# Patient Record
Sex: Female | Born: 1965 | Hispanic: Yes | Marital: Married | Smoking: Never smoker
Health system: Southern US, Community
[De-identification: ages and names within clinical notes are randomized; demographics above are authoritative.]

## PROBLEM LIST (undated history)

## (undated) DIAGNOSIS — Z86018 Personal history of other benign neoplasm: Secondary | ICD-10-CM

## (undated) DIAGNOSIS — D259 Leiomyoma of uterus, unspecified: Secondary | ICD-10-CM

## (undated) DIAGNOSIS — D649 Anemia, unspecified: Secondary | ICD-10-CM

## (undated) DIAGNOSIS — K219 Gastro-esophageal reflux disease without esophagitis: Secondary | ICD-10-CM

## (undated) HISTORY — DX: Leiomyoma of uterus, unspecified: D25.9

## (undated) HISTORY — DX: Personal history of other benign neoplasm: Z86.018

## (undated) HISTORY — DX: Anemia, unspecified: D64.9

---

## 2012-07-15 ENCOUNTER — Observation Stay: Payer: Self-pay | Admitting: Obstetrics and Gynecology

## 2012-07-15 LAB — COMPREHENSIVE METABOLIC PANEL
Albumin: 3.8 g/dL (ref 3.4–5.0)
Alkaline Phosphatase: 113 U/L (ref 50–136)
BUN: 13 mg/dL (ref 7–18)
Bilirubin,Total: 0.5 mg/dL (ref 0.2–1.0)
Calcium, Total: 8.4 mg/dL — ABNORMAL LOW (ref 8.5–10.1)
Chloride: 106 mmol/L (ref 98–107)
EGFR (African American): >60
Potassium: 3.5 mmol/L (ref 3.5–5.1)
SGOT(AST): 54 U/L — ABNORMAL HIGH (ref 15–37)
SGPT (ALT): 28 U/L (ref 12–78)
Sodium: 137 mmol/L (ref 136–145)
Total Protein: 7.6 g/dL (ref 6.4–8.2)

## 2012-07-15 LAB — URINALYSIS, COMPLETE
Glucose,UR: NEGATIVE mg/dL (ref 0–75)
Ketone: NEGATIVE
RBC,UR: 1 /HPF (ref 0–5)
Squamous Epithelial: 1

## 2012-07-15 LAB — CBC
HCT: 22.6 % — ABNORMAL LOW (ref 35.0–47.0)
HGB: 6.4 g/dL — ABNORMAL LOW (ref 12.0–16.0)
MCHC: 28.5 g/dL — ABNORMAL LOW (ref 32.0–36.0)
RBC: 3.98 10*6/uL (ref 3.80–5.20)
RDW: 19.6 % — ABNORMAL HIGH (ref 11.5–14.5)
WBC: 6.1 10*3/uL (ref 3.6–11.0)

## 2012-07-15 LAB — HCG, QUANTITATIVE, PREGNANCY: Beta Hcg, Quant.: <1 m[IU]/mL — ABNORMAL LOW

## 2012-07-16 LAB — CBC WITH DIFFERENTIAL/PLATELET
Basophil #: 0.1 10*3/uL (ref 0.0–0.1)
Basophil %: 0.9 %
Eosinophil #: 0.1 10*3/uL (ref 0.0–0.7)
HCT: 29.1 % — ABNORMAL LOW (ref 35.0–47.0)
HGB: 8.9 g/dL — ABNORMAL LOW (ref 12.0–16.0)
Lymphocyte #: 1.4 10*3/uL (ref 1.0–3.6)
Lymphocyte %: 25 %
MCH: 20 pg — ABNORMAL LOW (ref 26.0–34.0)
MCV: 65 fL — ABNORMAL LOW (ref 80–100)
Monocyte #: 0.4 x10 3/mm (ref 0.2–0.9)
Monocyte %: 6.7 %
Neutrophil %: 66 %
Platelet: 179 10*3/uL (ref 150–440)
RBC: 4.47 10*6/uL (ref 3.80–5.20)
RDW: 29.9 % — ABNORMAL HIGH (ref 11.5–14.5)

## 2012-09-14 ENCOUNTER — Emergency Department: Payer: Self-pay | Admitting: Emergency Medicine

## 2012-09-14 LAB — CBC
HCT: 37.4 % (ref 35.0–47.0)
HGB: 12.7 g/dL (ref 12.0–16.0)
MCV: 78 fL — ABNORMAL LOW (ref 80–100)
Platelet: 124 10*3/uL — ABNORMAL LOW (ref 150–440)
RBC: 4.77 10*6/uL (ref 3.80–5.20)
RDW: 30.6 % — ABNORMAL HIGH (ref 11.5–14.5)

## 2012-09-14 LAB — COMPREHENSIVE METABOLIC PANEL
Albumin: 3.9 g/dL (ref 3.4–5.0)
Anion Gap: 8 (ref 7–16)
BUN: 10 mg/dL (ref 7–18)
Calcium, Total: 8.6 mg/dL (ref 8.5–10.1)
Chloride: 109 mmol/L — ABNORMAL HIGH (ref 98–107)
Co2: 23 mmol/L (ref 21–32)
Creatinine: 0.99 mg/dL (ref 0.60–1.30)
Glucose: 105 mg/dL — ABNORMAL HIGH (ref 65–99)
Potassium: 3.4 mmol/L — ABNORMAL LOW (ref 3.5–5.1)
SGOT(AST): 24 U/L (ref 15–37)
SGPT (ALT): 23 U/L (ref 12–78)
Total Protein: 7.9 g/dL (ref 6.4–8.2)

## 2012-09-14 LAB — URINALYSIS, COMPLETE
Bilirubin,UR: NEGATIVE
Glucose,UR: NEGATIVE mg/dL (ref 0–75)
Ketone: NEGATIVE
Leukocyte Esterase: NEGATIVE
Ph: 7 (ref 4.5–8.0)
Protein: NEGATIVE
Specific Gravity: 1.019 (ref 1.003–1.030)
WBC UR: 1 /HPF (ref 0–5)

## 2014-07-22 NOTE — Consult Note (Signed)
Brief Consult Note: Patient was seen by consultant.   Recommend further assessment or treatment.   Orders entered.   Discussed with Attending MD.   Comments: Menorrhagia to anemia, currently no active bleeding.  Transfuse 2 units.  Patient with non-cardiac chest pain PPI, medicine following.  Normal EKG and negative cardiac enzymes, pain reproducible.  Electronic Signatures: Dorthula Nettles (MD)  (Signed 16-Apr-14 22:17)  Authored: Brief Consult Note   Last Updated: 16-Apr-14 22:17 by Dorthula Nettles (MD)

## 2015-06-23 ENCOUNTER — Emergency Department: Payer: Self-pay

## 2015-06-23 ENCOUNTER — Encounter: Payer: Self-pay | Admitting: Emergency Medicine

## 2015-06-23 ENCOUNTER — Inpatient Hospital Stay
Admission: EM | Admit: 2015-06-23 | Discharge: 2015-06-27 | DRG: 759 | Disposition: A | Discharge status: Home / Self Care | Payer: Self-pay | Attending: Obstetrics and Gynecology | Admitting: Obstetrics and Gynecology

## 2015-06-23 DIAGNOSIS — D509 Iron deficiency anemia, unspecified: Secondary | ICD-10-CM | POA: Diagnosis present

## 2015-06-23 DIAGNOSIS — Z86018 Personal history of other benign neoplasm: Secondary | ICD-10-CM | POA: Diagnosis present

## 2015-06-23 DIAGNOSIS — D259 Leiomyoma of uterus, unspecified: Secondary | ICD-10-CM | POA: Diagnosis present

## 2015-06-23 DIAGNOSIS — R1030 Lower abdominal pain, unspecified: Secondary | ICD-10-CM

## 2015-06-23 DIAGNOSIS — N7093 Salpingitis and oophoritis, unspecified: Principal | ICD-10-CM | POA: Diagnosis present

## 2015-06-23 LAB — URINALYSIS COMPLETE WITH MICROSCOPIC (ARMC ONLY)
BILIRUBIN URINE: NEGATIVE
Bacteria, UA: NONE SEEN
GLUCOSE, UA: NEGATIVE mg/dL
HGB URINE DIPSTICK: NEGATIVE
LEUKOCYTES UA: NEGATIVE
NITRITE: NEGATIVE
Protein, ur: NEGATIVE mg/dL
pH: 5 (ref 5.0–8.0)

## 2015-06-23 LAB — HCG, QUANTITATIVE, PREGNANCY: HCG, BETA CHAIN, QUANT, S: 1 m[IU]/mL (ref <5)

## 2015-06-23 LAB — CBC WITH DIFFERENTIAL/PLATELET
BASOS ABS: 0 10*3/uL (ref 0–0.1)
BASOS PCT: 0 %
EOS ABS: 0 10*3/uL (ref 0–0.7)
EOS PCT: 0 %
HCT: 29.1 % — ABNORMAL LOW (ref 35.0–47.0)
Hemoglobin: 8.9 g/dL — ABNORMAL LOW (ref 12.0–16.0)
Lymphocytes Relative: 5 %
Lymphs Abs: 0.7 10*3/uL — ABNORMAL LOW (ref 1.0–3.6)
MCH: 18.6 pg — ABNORMAL LOW (ref 26.0–34.0)
MCHC: 30.5 g/dL — ABNORMAL LOW (ref 32.0–36.0)
MCV: 61 fL — ABNORMAL LOW (ref 80.0–100.0)
MONO ABS: 0.5 10*3/uL (ref 0.2–0.9)
Monocytes Relative: 3 %
Neutro Abs: 13.7 10*3/uL — ABNORMAL HIGH (ref 1.4–6.5)
Neutrophils Relative %: 92 %
PLATELETS: 282 10*3/uL (ref 150–440)
RBC: 4.77 MIL/uL (ref 3.80–5.20)
RDW: 19.6 % — AB (ref 11.5–14.5)
WBC: 15 10*3/uL — AB (ref 3.6–11.0)

## 2015-06-23 LAB — COMPREHENSIVE METABOLIC PANEL
ALT: 40 U/L (ref 14–54)
AST: 43 U/L — AB (ref 15–41)
Albumin: 3.4 g/dL — ABNORMAL LOW (ref 3.5–5.0)
Alkaline Phosphatase: 223 U/L — ABNORMAL HIGH (ref 38–126)
Anion gap: 10 (ref 5–15)
BILIRUBIN TOTAL: 0.8 mg/dL (ref 0.3–1.2)
BUN: 10 mg/dL (ref 6–20)
CO2: 22 mmol/L (ref 22–32)
CREATININE: 0.77 mg/dL (ref 0.44–1.00)
Calcium: 8.7 mg/dL — ABNORMAL LOW (ref 8.9–10.3)
Chloride: 102 mmol/L (ref 101–111)
Glucose, Bld: 124 mg/dL — ABNORMAL HIGH (ref 65–99)
Potassium: 3.6 mmol/L (ref 3.5–5.1)
Sodium: 134 mmol/L — ABNORMAL LOW (ref 135–145)
TOTAL PROTEIN: 7.7 g/dL (ref 6.5–8.1)

## 2015-06-23 LAB — POCT PREGNANCY, URINE: PREG TEST UR: NEGATIVE

## 2015-06-23 LAB — LACTIC ACID, PLASMA: Lactic Acid, Venous: 0.9 mmol/L (ref 0.5–2.0)

## 2015-06-23 LAB — LIPASE, BLOOD: LIPASE: 16 U/L (ref 11–51)

## 2015-06-23 LAB — RAPID HIV SCREEN (HIV 1/2 AB+AG)
HIV 1/2 Antibodies: NONREACTIVE
HIV-1 P24 Antigen - HIV24: NONREACTIVE

## 2015-06-23 LAB — CHLAMYDIA/NGC RT PCR (ARMC ONLY)
CHLAMYDIA TR: NOT DETECTED
N gonorrhoeae: NOT DETECTED

## 2015-06-23 MED ORDER — DOXYCYCLINE HYCLATE 100 MG PO TABS
100.0000 mg | ORAL_TABLET | Freq: Two times a day (BID) | ORAL | Status: DC
  Administered 2015-06-24: 100 mg via ORAL
  Filled 2015-06-23: qty 1

## 2015-06-23 MED ORDER — MORPHINE SULFATE (PF) 4 MG/ML IV SOLN: 4.0000 mg | Freq: Once | INTRAVENOUS | Status: DC

## 2015-06-23 MED ORDER — ALUM & MAG HYDROXIDE-SIMETH 200-200-20 MG/5ML PO SUSP
30.0000 mL | ORAL | Status: DC | PRN
  Filled 2015-06-23: qty 30

## 2015-06-23 MED ORDER — IBUPROFEN 600 MG PO TABS
600.0000 mg | ORAL_TABLET | Freq: Four times a day (QID) | ORAL | Status: DC | PRN
  Administered 2015-06-24 – 2015-06-25 (×2): 600 mg via ORAL
  Filled 2015-06-23 (×3): qty 1

## 2015-06-23 MED ORDER — DIATRIZOATE MEGLUMINE & SODIUM 66-10 % PO SOLN: 15.0000 mL | Freq: Once | ORAL | Status: DC

## 2015-06-23 MED ORDER — DEXTROSE 5 % IV SOLN
2.0000 g | Freq: Once | INTRAVENOUS | Status: AC
  Administered 2015-06-23: 2 g via INTRAVENOUS
  Filled 2015-06-23: qty 2

## 2015-06-23 MED ORDER — ACETAMINOPHEN 325 MG PO TABS
650.0000 mg | ORAL_TABLET | Freq: Once | ORAL | Status: AC
  Administered 2015-06-23: 650 mg via ORAL
  Filled 2015-06-23: qty 2

## 2015-06-23 MED ORDER — OXYCODONE-ACETAMINOPHEN 5-325 MG PO TABS
1.0000 | ORAL_TABLET | ORAL | Status: DC | PRN
  Administered 2015-06-23: 2 via ORAL
  Administered 2015-06-25 – 2015-06-26 (×4): 1 via ORAL
  Administered 2015-06-27: 2 via ORAL
  Filled 2015-06-23 (×3): qty 1
  Filled 2015-06-23 (×3): qty 2
  Filled 2015-06-23 (×2): qty 1

## 2015-06-23 MED ORDER — BISACODYL 5 MG PO TBEC
5.0000 mg | DELAYED_RELEASE_TABLET | Freq: Every day | ORAL | Status: DC | PRN
Start: 2015-06-23 — End: 2015-06-27

## 2015-06-23 MED ORDER — ONDANSETRON HCL 4 MG/2ML IJ SOLN
4.0000 mg | Freq: Once | INTRAMUSCULAR | Status: AC
  Administered 2015-06-23: 4 mg via INTRAVENOUS
  Filled 2015-06-23: qty 2

## 2015-06-23 MED ORDER — DOCUSATE SODIUM 100 MG PO CAPS
100.0000 mg | ORAL_CAPSULE | Freq: Two times a day (BID) | ORAL | Status: DC
  Administered 2015-06-23 – 2015-06-27 (×6): 100 mg via ORAL
  Filled 2015-06-23 (×6): qty 1

## 2015-06-23 MED ORDER — MAGNESIUM CITRATE PO SOLN: 1.0000 | Freq: Once | ORAL | Status: DC | PRN

## 2015-06-23 MED ORDER — MORPHINE SULFATE (PF) 4 MG/ML IV SOLN
4.0000 mg | Freq: Once | INTRAVENOUS | Status: AC
  Administered 2015-06-23: 4 mg via INTRAVENOUS
  Filled 2015-06-23: qty 1

## 2015-06-23 MED ORDER — ONDANSETRON HCL 4 MG PO TABS: 4.0000 mg | ORAL_TABLET | Freq: Four times a day (QID) | ORAL | Status: DC | PRN

## 2015-06-23 MED ORDER — IOPAMIDOL (ISOVUE-300) INJECTION 61%
100.0000 mL | Freq: Once | INTRAVENOUS | Status: AC | PRN
  Administered 2015-06-23: 100 mL via INTRAVENOUS

## 2015-06-23 MED ORDER — METRONIDAZOLE 500 MG PO TABS
2000.0000 mg | ORAL_TABLET | Freq: Once | ORAL | Status: AC
  Administered 2015-06-23: 2000 mg via ORAL
  Filled 2015-06-23: qty 4

## 2015-06-23 MED ORDER — TEMAZEPAM 15 MG PO CAPS
15.0000 mg | ORAL_CAPSULE | Freq: Every evening | ORAL | Status: DC | PRN
  Filled 2015-06-23: qty 1

## 2015-06-23 MED ORDER — ONDANSETRON HCL 4 MG/2ML IJ SOLN
4.0000 mg | Freq: Four times a day (QID) | INTRAMUSCULAR | Status: DC | PRN
  Administered 2015-06-23 – 2015-06-25 (×4): 4 mg via INTRAVENOUS
  Filled 2015-06-23 (×4): qty 2

## 2015-06-23 MED ORDER — LACTATED RINGERS IV SOLN
INTRAVENOUS | Status: DC
Start: 2015-06-23 — End: 2015-06-27
  Administered 2015-06-23 – 2015-06-27 (×10): via INTRAVENOUS

## 2015-06-23 MED ORDER — MAGNESIUM HYDROXIDE 400 MG/5ML PO SUSP: 30.0000 mL | Freq: Every day | ORAL | Status: DC | PRN

## 2015-06-23 MED ORDER — SODIUM CHLORIDE 0.9 % IV BOLUS (SEPSIS)
1000.0000 mL | Freq: Once | INTRAVENOUS | Status: AC
  Administered 2015-06-23: 1000 mL via INTRAVENOUS

## 2015-06-23 MED ORDER — HYDROMORPHONE HCL 1 MG/ML IJ SOLN
0.2000 mg | INTRAMUSCULAR | Status: DC | PRN
  Administered 2015-06-25: 0.6 mg via INTRAVENOUS
  Filled 2015-06-23: qty 1

## 2015-06-23 MED ORDER — CEFOXITIN SODIUM 2 G IV SOLR
2.0000 g | Freq: Four times a day (QID) | INTRAVENOUS | Status: DC
  Administered 2015-06-23 – 2015-06-25 (×7): 2 g via INTRAVENOUS
  Filled 2015-06-23 (×12): qty 2

## 2015-06-23 MED ORDER — DOXYCYCLINE HYCLATE 100 MG PO TABS
100.0000 mg | ORAL_TABLET | Freq: Once | ORAL | Status: AC
  Administered 2015-06-23: 100 mg via ORAL
  Filled 2015-06-23: qty 1

## 2015-06-23 NOTE — ED Provider Notes (Addendum)
[facility] Emergency Department Provider Note  ____________________________________________   I have reviewed the triage vital signs and the nursing notes.   HISTORY  Chief Complaint Abdominal Pain    HPI Sherry Ward is a 50 y.o. female presents today with diffuse abdominal pain, mostly in the lower pelvic associate with occasional fever last seminary days. She states she has a little bit of dysuria. She has not vomited. She also states that this morning she had to absence of diarrhea. She denies any melena or bright red blood per rectum but she's been having watery diarrhea she states. She statesshe has not had any other surgeries except for gynecologic. She states that she has no history of diverticulitis. Most of the pain is on the left side although it radiates all around. She denies flank pain. Nothing makes the pain better nothing makes the pain worse. She denies any significant sick contacts.  History reviewed. No pertinent past medical history.  There are no active problems to display for this patient.   Past Surgical History  Procedure Laterality Date  . Cesarean section      x2    No current outpatient prescriptions on file.  Allergies Review of patient's allergies indicates no known allergies.  History reviewed. No pertinent family history.  Social History Social History  Substance Use Topics  . Smoking status: Never Smoker   . Smokeless tobacco: None  . Alcohol Use: None    Review of Systems Constitutional: Positive fever/chills Eyes: No visual changes. ENT: No sore throat. No stiff neck no neck pain Cardiovascular: Denies chest pain. Respiratory: Denies shortness of breath. Gastrointestinal:   no vomiting.  Positive diarrhea.  No constipation. Genitourinary: See history of present illness Musculoskeletal: Negative lower extremity swelling Skin: Negative for rash. Neurological: Negative for headaches, focal  weakness or numbness. 10-point ROS otherwise negative.  ____________________________________________   PHYSICAL EXAM:  VITAL SIGNS: ED Triage Vitals  Enc Vitals Group     BP 06/23/15 0958 99/51 mmHg     Pulse Rate 06/23/15 0958 117     Resp 06/23/15 0958 20     Temp 06/23/15 0958 100.6 F (38.1 C)     Temp Source 06/23/15 0958 Oral     SpO2 06/23/15 0958 98 %     Weight 06/23/15 0958 140 lb (63.504 kg)     Height 06/23/15 0958 5\' 5"  (1.651 m)     Head Cir --      Peak Flow --      Pain Score 06/23/15 0958 10     Pain Loc --      Pain Edu? --      Excl. in [place]? --     Constitutional: Alert and oriented. She is a nontoxic, but anxious Eyes: Conjunctivae are normal. PERRL. EOMI. Head: Atraumatic. Nose: No congestion/rhinnorhea. Mouth/Throat: Mucous membranes are moist.  Oropharynx non-erythematous. Neck: No stridor.   Nontender with no meningismus Cardiovascular: Normal rate, regular rhythm. Grossly normal heart sounds.  Good peripheral circulation. Respiratory: Normal respiratory effort.  No retractions. Lungs CTAB. Abdominal: Diffusely tender but presently tender in the left lower quadrant with voluntary guarding. No distention. No rebound Back:  There is no focal tenderness or step off there is no midline tenderness there are no lesions noted. there is no CVA tenderness Musculoskeletal: No lower extremity tenderness. No joint effusions, no DVT signs strong distal pulses no edema Neurologic:  Normal speech and language. No gross focal neurologic deficits are appreciated.  Skin:  Skin is warm, dry and intact. No rash noted. Psychiatric: Mood and affect are normal. Speech and behavior are normal.  ____________________________________________   LABS (all labs ordered are listed, but only abnormal results are displayed)  Labs Reviewed  COMPREHENSIVE METABOLIC PANEL  LIPASE, BLOOD  CBC WITH DIFFERENTIAL/PLATELET  URINALYSIS COMPLETEWITH MICROSCOPIC ([place])    ____________________________________________  EKG  I personally interpreted any EKGs ordered by me or triage  ____________________________________________  RADIOLOGY  I reviewed any imaging ordered by me or triage that were performed during my shift and, if possible, patient and/or family made aware of any abnormal findings. ____________________________________________   PROCEDURES  Procedure(s) performed: None  Critical Care performed: None  ____________________________________________   INITIAL IMPRESSION / ASSESSMENT AND PLAN / ED COURSE  Pertinent labs & imaging results that were available during my care of the patient were reviewed by me and considered in my medical decision making (see chart for details). A sheet with 1 week of abdominal pain and vomiting with some mild dysuria also will check urinalysis, given her not in significant abdominal tenderness, we'll obtain a CT scan of the abdomen for concern of possible possible diverticular disease. We'll give her pain medication, nausea medication, IV fluids and reassess  ----------------------------------------- 1:24 PM on 06/23/2015 -----------------------------------------  Patient does not have diverticulitis however she has a tubular infected structure next to the ovaries. She is not pregnant.Pelvic exam: Female nurse chaperone present, no external lesions noted, physiologic vaginal discharge noted possibly purulent dark discharge, minimal cervical motion tenderness, or significant bilateral adnexal tenderness, no mass palpated, there is no significant uterine tenderness or mass. No vaginal bleeding  Ct suggests likely  TOA, we are giving her doxycycline and cefoxitin, we will discuss with OB/GYN and reassess  ----------------------------------------- 1:29 PM on 06/23/2015 -----------------------------------------  Discussed with OB/GYN, Dr. Marcelline Mates, very much appreciate the consult. She agrees with management.  She will come see the patient she did ask me to get an ultrasound to verify likely TOA. She agrees with antibiotic choice and continued therapy as we have already initiated. Patient nontoxic in appearance. ____________________________________________   FINAL CLINICAL IMPRESSION(S) / ED DIAGNOSES  Final diagnoses:  None      This chart was dictated using voice recognition software.  Despite best efforts to proofread,  errors can occur which can change meaning.     Schuyler Amor, MD 06/23/15 1025  Schuyler Amor, MD 06/23/15 [place], MD 06/23/15 [place], MD 06/23/15 1330

## 2015-06-23 NOTE — Progress Notes (Signed)
Rosemarie Ax here to interpret meds and plan of care to patient and orient to room

## 2015-06-23 NOTE — ED Notes (Signed)
abd pain, burning with urination x 8 days

## 2015-06-23 NOTE — H&P (Addendum)
GYNECOLOGY HISTORY AND PHYSICAL  Reason for Consult: pelvic pain with pelvic mass Referring Physician: Charlotte Crumb, MD (ER Physician)  HPI:  Sherry Ward is an 50 y.o. [phone] female who presented to the Emergency room secondary to lower abdominal pain and occasional fever over the past week.  Patient notes that the pain has progressively worsened. Pain is intermittent, and alternates sides, but is mostly on the left.  Pain radiates throughout lower pelvis. No alleviating factors. Denies vomiting, but has had some occasional nausea and diarrhea.  Tmax noted in ER was 100.6.   Pertinent Gynecological History: Menses: flow is regular, however cycles are beginning to become irregular, occasionally skipping months.   Contraception: none Blood transfusions: none Sexually transmitted diseases: no past history Last pap: normal Date: ~ 2 years ago (performed in [place])    Menstrual History: Menarche age: 46  No LMP recorded (lmp unknown).Was approximately 3 months ago.    History reviewed. No pertinent past medical history.  Past Surgical History  Procedure Laterality Date  . Cesarean section      x2    History reviewed. No pertinent family history.  Social History   Social History  . Marital Status: Married    Spouse Name: N/A  . Number of Children: N/A  . Years of Education: N/A   Occupational History  . Not on file.   Social History Main Topics  . Smoking status: Never Smoker   . Smokeless tobacco: Not on file  . Alcohol Use: Not on file  . Drug Use: Not on file  . Sexual Activity: Not on file   Other Topics Concern  . Not on file   Social History Narrative  . No narrative on file    Allergies: No Known Allergies  Medications:  I have reviewed the patient's current medications. No prescriptions prior to admission    Review of Systems  Constitutional: Positive for fever. Negative for chills, weight loss, malaise/fatigue and diaphoresis.  HENT:  Negative for sore throat.   Eyes: Negative for blurred vision and discharge.  Respiratory: Negative for cough and shortness of breath.   Cardiovascular: Negative for chest pain and palpitations.  Gastrointestinal: Positive for nausea, abdominal pain and diarrhea. Negative for heartburn, vomiting, constipation and blood in stool.  Genitourinary: Negative for dysuria, urgency, frequency, hematuria and flank pain.  Musculoskeletal: Negative for myalgias, joint pain and neck pain.  Skin: Negative for itching and rash.  Neurological: Negative for dizziness, seizures and headaches.  Endo/Heme/Allergies: Does not bruise/bleed easily.  Psychiatric/Behavioral: Negative for depression and substance abuse. The patient is not nervous/anxious.      BP 91/47 mmHg  Pulse 90  Temp(Src) 98.6 F (37 C) (Oral)  Resp 20  Ht _0  (1.651 m)  Wt 140 lb (63.504 kg)  BMI 23.30 kg/m2  SpO2 99%  LMP (LMP Unknown)  Physical Exam  Constitutional: She is oriented to person, place, and time. She appears well-developed and well-nourished. She appears distressed.  HENT:  Head: Normocephalic and atraumatic.  Mouth/Throat: Oropharynx is clear and moist.  Eyes: Conjunctivae and EOM are normal. Pupils are equal, round, and reactive to light.  Neck: Normal range of motion. Neck supple. No tracheal deviation present. No thyromegaly present.  GI: Soft. Bowel sounds are normal. She exhibits no distension and no mass. There is tenderness. There is no rebound and no guarding.  Tenderness in lower quadrants bilaterally, right>left  Genitourinary: Uterus normal. No vaginal discharge found.  No cervical motion tenderness. Uterus enlarged, ~  14 week size. Tenderness noted in bilateral adnexae.    Musculoskeletal: Normal range of motion. She exhibits no edema or tenderness.  Neurological: She is alert and oriented to person, place, and time.  Skin: Skin is warm and dry. No rash noted. She is not diaphoretic. No erythema.   Psychiatric: She has a normal mood and affect. Her behavior is normal.    Labs:  Results for orders placed or performed during the hospital encounter of 06/23/15 (from the past 48 hour(s))  Comprehensive metabolic panel     Status: Abnormal   Collection Time: 06/23/15 10:13 AM  Result Value Ref Range   Sodium 134 (L) 135 - 145 mmol/L   Potassium 3.6 3.5 - 5.1 mmol/L   Chloride 102 101 - 111 mmol/L   CO2 22 22 - 32 mmol/L   Glucose, Bld 124 (H) 65 - 99 mg/dL   BUN 10 6 - 20 mg/dL   Creatinine, Ser 0.77 0.44 - 1.00 mg/dL   Calcium 8.7 (L) 8.9 - 10.3 mg/dL   Total Protein 7.7 6.5 - 8.1 g/dL   Albumin 3.4 (L) 3.5 - 5.0 g/dL   AST 43 (H) 15 - 41 U/L   ALT 40 14 - 54 U/L   Alkaline Phosphatase 223 (H) 38 - 126 U/L   Total Bilirubin 0.8 0.3 - 1.2 mg/dL   GFR calc non Af Amer >60 >60 mL/min   GFR calc Af Amer >60 >60 mL/min    Comment: (NOTE) The eGFR has been calculated using the CKD EPI equation. This calculation has not been validated in all clinical situations. eGFR's persistently <60 mL/min signify possible Chronic Kidney Disease.    Anion gap 10 5 - 15  Lipase, blood     Status: None   Collection Time: 06/23/15 10:13 AM  Result Value Ref Range   Lipase 16 11 - 51 U/L  CBC with Differential     Status: Abnormal   Collection Time: 06/23/15 10:13 AM  Result Value Ref Range   WBC 15.0 (H) 3.6 - 11.0 K/uL   RBC 4.77 3.80 - 5.20 MIL/uL   Hemoglobin 8.9 (L) 12.0 - 16.0 g/dL   HCT 29.1 (L) 35.0 - 47.0 %   MCV 61.0 (L) 80.0 - 100.0 fL   MCH 18.6 (L) 26.0 - 34.0 pg   MCHC 30.5 (L) 32.0 - 36.0 g/dL   RDW 19.6 (H) 11.5 - 14.5 %   Platelets 282 150 - 440 K/uL   Neutrophils Relative % 92 %   Neutro Abs 13.7 (H) 1.4 - 6.5 K/uL   Lymphocytes Relative 5 %   Lymphs Abs 0.7 (L) 1.0 - 3.6 K/uL   Monocytes Relative 3 %   Monocytes Absolute 0.5 0.2 - 0.9 K/uL   Eosinophils Relative 0 %   Eosinophils Absolute 0.0 0 - 0.7 K/uL   Basophils Relative 0 %   Basophils Absolute 0.0 0 - 0.1  K/uL  Urinalysis complete, with microscopic     Status: Abnormal   Collection Time: 06/23/15  1:08 PM  Result Value Ref Range   Color, Urine YELLOW (A) YELLOW   APPearance CLEAR (A) CLEAR   Glucose, UA NEGATIVE NEGATIVE mg/dL   Bilirubin Urine NEGATIVE NEGATIVE   Ketones, ur TRACE (A) NEGATIVE mg/dL   Specific Gravity, Urine >1.060 (H) 1.005 - 1.030   Hgb urine dipstick NEGATIVE NEGATIVE   pH 5.0 5.0 - 8.0   Protein, ur NEGATIVE NEGATIVE mg/dL   Nitrite NEGATIVE NEGATIVE   Leukocytes, UA  NEGATIVE NEGATIVE   RBC / HPF 0-5 0 - 5 RBC/hpf   WBC, UA 0-5 0 - 5 WBC/hpf   Bacteria, UA NONE SEEN NONE SEEN   Squamous Epithelial / LPF 0-5 (A) NONE SEEN   Mucous PRESENT   Chlamydia/NGC rt PCR     Status: None   Collection Time: 06/23/15  1:08 PM  Result Value Ref Range   Specimen source GC/Chlam VAGINA    Chlamydia Tr NOT DETECTED NOT DETECTED   N gonorrhoeae NOT DETECTED NOT DETECTED    Comment: (NOTE) 100  This methodology has not been evaluated in pregnant women or in 200  patients with a history of hysterectomy. 300 400  This methodology will not be performed on patients less than 1  years of age.   Lactic acid, plasma     Status: None   Collection Time: 06/23/15  1:08 PM  Result Value Ref Range   Lactic Acid, Venous 0.9 0.5 - 2.0 mmol/L  hCG, quantitative, pregnancy     Status: None   Collection Time: 06/23/15  1:08 PM  Result Value Ref Range   hCG, Beta Chain, Quant, S 1 <5 mIU/mL    Comment:          GEST. AGE      CONC.  (mIU/mL)   <=1 WEEK        5 - 50     2 WEEKS       50 - 500     3 WEEKS       100 - 10,000     4 WEEKS     1,000 - 30,000     5 WEEKS     3,500 - 115,000   6-8 WEEKS     12,000 - 270,000    12 WEEKS     15,000 - 220,000        FEMALE AND NON-PREGNANT FEMALE:     LESS THAN 5 mIU/mL   Rapid HIV screen (HIV 1/2 Ab+Ag)     Status: None   Collection Time: 06/23/15  1:08 PM  Result Value Ref Range   HIV-1 P24 Antigen - HIV24 NON REACTIVE NON REACTIVE    HIV 1/2 Antibodies NON REACTIVE NON REACTIVE   Interpretation (HIV Ag Ab)      A non reactive test result means that HIV 1 or HIV 2 antibodies and HIV 1 p24 antigen were not detected in the specimen.  Pregnancy, urine POC     Status: None   Collection Time: 06/23/15  1:18 PM  Result Value Ref Range   Preg Test, Ur NEGATIVE NEGATIVE    Comment:        THE SENSITIVITY OF THIS METHODOLOGY IS >24 mIU/mL     Imaging: US Transvaginal/Pelvis Complete Non-ob  06/23/2015  CLINICAL DATA:  Tubo-ovarian abscess EXAM: TRANSABDOMINAL AND TRANSVAGINAL ULTRASOUND OF PELVIS TECHNIQUE: Both transabdominal and transvaginal ultrasound examinations of the pelvis were performed. Transabdominal technique was performed for global imaging of the pelvis including uterus, ovaries, adnexal regions, and pelvic cul-de-sac. It was necessary to proceed with endovaginal exam following the transabdominal exam to visualize the endometrium and ovaries. COMPARISON:  None FINDINGS: Uterus Measurements: 10.7 x 7.1 x 7.1 cm. Multiple hypoechoic uterine masses with the largest measuring 6.5 x 6.7 x 3.4 cm near the fundus. Complex cystic mass in the cervix measuring 2.1 x 1 x 2.3 cm likely representing a nabothian cyst. Endometrium Thickness: 14.7 mm.  No focal abnormality visualized. Right ovary Measurements: 4.8  x 2.1 x 2.5 cm. Normal appearance/no adnexal mass. Left ovary Complex left adnexal mass measuring 7.6 x 3.2 x 3.8 cm with complex fluid in the left adnexal region concerning for a tubo-ovarian abscess. Other findings No abnormal free fluid. IMPRESSION: 1. Complex left adnexal mass measuring 7.6 x 3.2 x 3.8 cm with complex fluid in the left adnexal region concerning for a tubo-ovarian abscess. 2. Fibroid uterus. Electronically Signed   By: Kathreen Devoid   On: 06/23/2015 14:52   Ct Abdomen Pelvis W Contrast  06/23/2015  CLINICAL DATA:  Left lower quadrant pain. Diarrhea for the past week. Elevated white blood cell count. EXAM: CT  ABDOMEN AND PELVIS WITH CONTRAST TECHNIQUE: Multidetector CT imaging of the abdomen and pelvis was performed using the standard protocol following bolus administration of intravenous contrast. CONTRAST:  152m ISOVUE-300 IOPAMIDOL (ISOVUE-300) INJECTION 61% COMPARISON:  07/15/2012 FINDINGS: Respiratory motion artifact is present in the visualized lung bases with dependent subsegmental atelectasis bilaterally. There is no pleural effusion. The liver, gallbladder, spleen, adrenal glands, kidneys, and pancreas have an unremarkable enhanced appearance. There is no biliary dilatation. There is prominent inflammation in the left pelvis. There is a dilated fluid-filled tubular structure with prominent wall enhancement in the left adnexa which appears separate from colon and small bowel with extensive surrounding inflammatory fat stranding. This structure in total measures approximately 7 x 4 cm, with portions of the structure's wall at its anterosuperior aspect appearing less well-defined and potentially reflecting perforation. There is a small amount of fluid more medially in the pelvis along the superior margin of the uterus without well-defined rim enhancement. Mild hyperenhancement of several adjacent loops of small bowel in this region is likely reactive due to the adjacent inflammation rather than reflecting primary small bowel pathology. There is no evidence of bowel obstruction. Small amount of partially loculated fluid extends into the right adnexal region with mild hyperenhancement in the region of the right ovary/tube. The uterus is enlarged by numerous fibroids, with the largest measuring 7.0 x 4.0 cm at the fundus and having mildly enlarged in the interim. A 7 mm left pelvic sidewall lymph node is slightly larger than on the prior CT and likely reactive. The aorta is normal in caliber. No acute osseous abnormality is identified. IMPRESSION: Extensive inflammatory process in the pelvis with dilated tubular  structure in the left adnexa most concerning for PID with pyosalpinx and tubo-ovarian abscess. Electronically Signed   By: ALogan BoresM.D.   On: 06/23/2015 12:31    Assessment/Plan: 1. Tubo-ovarian abscess - Will admit patient to observation.  Patient treated with a dose of IV Cefoxitin and PO Doxycyline.  Will continue regimen until patient afebrile for at least 24 hours and pain has decreased.  Will also treat pain with PO meds prn. GC/Cl negative. Tylenol prn for fevers. Patient understands that if no response to antibiotics, ma require surgical intervention.  2. Nausea/Diarrhea  - Can use Pepto-Bismol if symptomatic.  Has had no episodes while in ER.  3. Leukocytosis - Likely secondary to TOA.  Will monitor with serial CBCs.  3. Fibroid uterus  - Noted on CT scan and ultrasound.  Patient denies prior knowledge of fibroids, has been relatively symptomatic. No need for further management at this time.  4. Anemia  - Chronic, unknown cause.  Review of chart notes h/o anemia since at least 2014.  Appears to be related to iron deficinecy as MCV is low. May be secondary to fibroid uterus and menstrual bleeding.  Will treat with PO iron for now as patient is asymptomatic.     Rubie Maid  [facility] 06/23/2015

## 2015-06-24 DIAGNOSIS — N949 Unspecified condition associated with female genital organs and menstrual cycle: Secondary | ICD-10-CM

## 2015-06-24 DIAGNOSIS — R1900 Intra-abdominal and pelvic swelling, mass and lump, unspecified site: Secondary | ICD-10-CM

## 2015-06-24 DIAGNOSIS — D509 Iron deficiency anemia, unspecified: Secondary | ICD-10-CM | POA: Diagnosis present

## 2015-06-24 DIAGNOSIS — Z86018 Personal history of other benign neoplasm: Secondary | ICD-10-CM | POA: Diagnosis present

## 2015-06-24 DIAGNOSIS — D259 Leiomyoma of uterus, unspecified: Secondary | ICD-10-CM

## 2015-06-24 HISTORY — DX: Leiomyoma of uterus, unspecified: D25.9

## 2015-06-24 HISTORY — DX: Personal history of other benign neoplasm: Z86.018

## 2015-06-24 LAB — CBC WITH DIFFERENTIAL/PLATELET
BASOS ABS: 0 10*3/uL (ref 0–0.1)
BASOS ABS: 0 10*3/uL (ref 0–0.1)
Basophils Relative: 0 %
Eosinophils Absolute: 0 10*3/uL (ref 0–0.7)
Eosinophils Absolute: 0 10*3/uL (ref 0–0.7)
Eosinophils Relative: 0 %
Eosinophils Relative: 0 %
HEMATOCRIT: 23.7 % — AB (ref 35.0–47.0)
HEMATOCRIT: 24.2 % — AB (ref 35.0–47.0)
HEMOGLOBIN: 7.3 g/dL — AB (ref 12.0–16.0)
Hemoglobin: 7.1 g/dL — ABNORMAL LOW (ref 12.0–16.0)
LYMPHS PCT: 6 %
Lymphs Abs: 0.8 10*3/uL — ABNORMAL LOW (ref 1.0–3.6)
Lymphs Abs: 0.9 10*3/uL — ABNORMAL LOW (ref 1.0–3.6)
MCH: 18.4 pg — ABNORMAL LOW (ref 26.0–34.0)
MCH: 18.9 pg — ABNORMAL LOW (ref 26.0–34.0)
MCHC: 29.9 g/dL — ABNORMAL LOW (ref 32.0–36.0)
MCHC: 30.3 g/dL — ABNORMAL LOW (ref 32.0–36.0)
MCV: 61.6 fL — AB (ref 80.0–100.0)
MCV: 62.3 fL — AB (ref 80.0–100.0)
MONO ABS: 0.4 10*3/uL (ref 0.2–0.9)
Monocytes Absolute: 0.5 10*3/uL (ref 0.2–0.9)
Monocytes Relative: 4 %
Monocytes Relative: 3 %
NEUTROS ABS: 13.4 10*3/uL — AB (ref 1.4–6.5)
NEUTROS ABS: 14 10*3/uL — AB (ref 1.4–6.5)
NEUTROS PCT: 91 %
Neutrophils Relative %: 91 %
PLATELETS: 246 10*3/uL (ref 150–440)
Platelets: 284 10*3/uL (ref 150–440)
RBC: 3.84 MIL/uL (ref 3.80–5.20)
RBC: 3.89 MIL/uL (ref 3.80–5.20)
RDW: 20 % — ABNORMAL HIGH (ref 11.5–14.5)
RDW: 19.7 % — AB (ref 11.5–14.5)
WBC: 14.8 10*3/uL — ABNORMAL HIGH (ref 3.6–11.0)
WBC: 15.4 10*3/uL — ABNORMAL HIGH (ref 3.6–11.0)

## 2015-06-24 LAB — IRON AND TIBC
Iron: 7 ug/dL — ABNORMAL LOW (ref 28–170)
SATURATION RATIOS: 3 % — AB (ref 10.4–31.8)
TIBC: 269 ug/dL (ref 250–450)
UIBC: 262 ug/dL

## 2015-06-24 LAB — RETICULOCYTES
RBC.: 3.89 MIL/uL (ref 3.80–5.20)
RETIC CT PCT: 1.8 % (ref 0.4–3.1)
Retic Count, Absolute: 70 10*3/uL (ref 19.0–183.0)

## 2015-06-24 LAB — HIV ANTIBODY (ROUTINE TESTING W REFLEX): HIV SCREEN 4TH GENERATION: NONREACTIVE

## 2015-06-24 LAB — RPR: RPR: NONREACTIVE

## 2015-06-24 LAB — FERRITIN: FERRITIN: 54 ng/mL (ref 11–307)

## 2015-06-24 MED ORDER — DOXYCYCLINE HYCLATE 100 MG PO TABS
100.0000 mg | ORAL_TABLET | ORAL | Status: DC
  Administered 2015-06-24: 100 mg via ORAL
  Filled 2015-06-24 (×2): qty 1

## 2015-06-24 MED ORDER — FERROUS SULFATE 325 (65 FE) MG PO TABS
325.0000 mg | ORAL_TABLET | Freq: Three times a day (TID) | ORAL | Status: DC
  Administered 2015-06-24 – 2015-06-27 (×6): 325 mg via ORAL
  Filled 2015-06-24 (×7): qty 1

## 2015-06-24 MED ORDER — DOXYCYCLINE HYCLATE 100 MG PO TABS
100.0000 mg | ORAL_TABLET | Freq: Once | ORAL | Status: AC
  Administered 2015-06-24: 100 mg via ORAL
  Filled 2015-06-24: qty 1

## 2015-06-24 NOTE — Progress Notes (Signed)
1600 spoke with  Dr Marcelline Mates about patients 1400 labs.  She is aware and no further orders obtained.  RN reported patient is up without being dizzy and tolerating po without nausea.  No complaints of any pain today. Up to bathroom and voiding wnl.  Iv infusing wnl.  RN using Trudy interpreter today periodically to check to see if patient has questions and is understanding her care and medications. Trudy ordered patients food for supper. Awaiting her supper now

## 2015-06-24 NOTE — Progress Notes (Signed)
Dr Marcelline Mates aware of am CBC  and ordered redraw and labs  for now at 1400. Marland Kitchen Patient isnt dizzy , states no pain and is eating and drinking more. Voiding wnl

## 2015-06-24 NOTE — Progress Notes (Signed)
HD#2  Subjective: Patient reports nausea but denies vomiting. Is ambulating without difficulty, although noted slight dizziness overnight.  Notes pain has improved some. Denies chills/fevers.   Objective: I have reviewed patient's vital signs, intake and output, medications and labs.  Temp:  [98.2 F (36.8 C)-98.7 F (37.1 C)] 98.2 F (36.8 C) (03/25 0719) Pulse Rate:  [65-90] 65 (03/25 0719) Resp:  [16-22] 18 (03/25 0719) BP: (88-100)/(46-65) 88/46 mmHg (03/25 0719) SpO2:  [97 %-100 %] 99 % (03/25 0719)  General: alert and no distress Resp: clear to auscultation bilaterally Cardio: regular rate and rhythm, S1, S2 normal, no murmur, click, rub or gallop GI: normal findings: soft and no palpable masses and abnormal findings:  mild tenderness in the lower abdomen Pelvis: deferred.  Extremities: extremities normal, atraumatic, no cyanosis or edema  Labs:  Lab Results  Component Value Date   WBC 15.0* 06/23/2015   HGB 8.9* 06/23/2015   HCT 29.1* 06/23/2015   MCV 61.0* 06/23/2015   PLT 282 06/23/2015    Assessment/Plan: 1. Tubo-ovarian abscess - Continue current antibiotic regimen. Patient remained afebrile overnight.  Pain appears to be slowly improving. Will likely monitor for an additional day.  2. Leukocytosis - pending repeat CBC this a.m. to assess for response to current antibiotic regimen.  3. Anemia, chronic -due to iron deficiency - Placed on ferrous sulfate TID.  4. Fibroid uterus  - Asymptomatic per patient.   Rubie Maid 06/24/2015, 10:47 AM

## 2015-06-25 LAB — CBC
HCT: 23.6 % — ABNORMAL LOW (ref 35.0–47.0)
Hemoglobin: 7 g/dL — ABNORMAL LOW (ref 12.0–16.0)
MCH: 18.3 pg — ABNORMAL LOW (ref 26.0–34.0)
MCHC: 29.8 g/dL — AB (ref 32.0–36.0)
MCV: 61.5 fL — ABNORMAL LOW (ref 80.0–100.0)
PLATELETS: 305 10*3/uL (ref 150–440)
RBC: 3.83 MIL/uL (ref 3.80–5.20)
RDW: 20.1 % — AB (ref 11.5–14.5)
WBC: 14.8 10*3/uL — ABNORMAL HIGH (ref 3.6–11.0)

## 2015-06-25 LAB — URINE CULTURE

## 2015-06-25 MED ORDER — GENTAMICIN SULFATE 40 MG/ML IJ SOLN
1.5000 mg/kg | Freq: Three times a day (TID) | INTRAVENOUS | Status: DC
  Administered 2015-06-25 – 2015-06-27 (×6): 100 mg via INTRAVENOUS
  Filled 2015-06-25 (×9): qty 2.5

## 2015-06-25 MED ORDER — ONDANSETRON HCL 4 MG/2ML IJ SOLN
4.0000 mg | Freq: Four times a day (QID) | INTRAMUSCULAR | Status: DC
  Administered 2015-06-25 – 2015-06-27 (×4): 4 mg via INTRAVENOUS
  Filled 2015-06-25 (×6): qty 2

## 2015-06-25 MED ORDER — CLINDAMYCIN PHOSPHATE 900 MG/50ML IV SOLN
900.0000 mg | Freq: Three times a day (TID) | INTRAVENOUS | Status: DC
  Administered 2015-06-25 – 2015-06-27 (×6): 900 mg via INTRAVENOUS
  Filled 2015-06-25 (×9): qty 50

## 2015-06-25 MED ORDER — SODIUM CHLORIDE 0.9 % IV SOLN
2.0000 g | Freq: Four times a day (QID) | INTRAVENOUS | Status: DC
  Administered 2015-06-25 – 2015-06-27 (×8): 2 g via INTRAVENOUS
  Filled 2015-06-25 (×12): qty 2000

## 2015-06-25 MED ORDER — ONDANSETRON HCL 4 MG PO TABS
4.0000 mg | ORAL_TABLET | Freq: Four times a day (QID) | ORAL | Status: DC
  Administered 2015-06-27: 4 mg via ORAL
  Filled 2015-06-25: qty 1

## 2015-06-25 NOTE — Progress Notes (Addendum)
HD#3  Subjective: Patient continues to report nausea, and had an episode of vomiting this morning after breakfast. Is ambulating without difficulty, although noted slight dizziness noted when changing positions, lasts for a few seconds.  Notes pain has improves with medication. Denies chills/fevers.   Objective: I have reviewed patient's vital signs, intake and output, medications and labs.  Temp:  [97.8 F (36.6 C)-99.4 F (37.4 C)] 97.9 F (36.6 C) (03/26 1200) Pulse Rate:  [60-89] 60 (03/26 1200) Resp:  [16-20] 18 (03/26 1200) BP: (85-97)/(41-51) 92/50 mmHg (03/26 1200) SpO2:  [97 %-100 %] 98 % (03/26 1200)  General: alert and no distress Resp: clear to auscultation bilaterally Cardio: regular rate and rhythm, S1, S2 normal, no murmur, click, rub or gallop GI: normal findings: soft and no palpable masses and abnormal findings:  mild tenderness in the lower abdomen Pelvis: deferred. No vaginal bleeding.  Extremities: extremities normal, atraumatic, no cyanosis or edema  Labs:  CBC Latest Ref Rng 06/25/2015 06/24/2015 06/24/2015  WBC 3.6 - 11.0 K/uL 14.8(H) 15.4(H) 14.8(H)  Hemoglobin 12.0 - 16.0 g/dL 7.0(L) 7.3(L) 7.1(L)  Hematocrit 35.0 - 47.0 % 23.6(L) 24.2(L) 23.7(L)  Platelets 150 - 440 K/uL 305 284 246     Assessment/Plan: 1. Tubo-ovarian abscess - Will change antibiotic therapy to ampicillin/gentamycin/clindamycin. Discussion had that if no response to antibiotics by morning, will need to consider surgical management of abscess (percoutaneous drainage vs laparoscopic removal).  Will repeat ultrasound in the morning to assess abscess.  -  Patient remained afebrile again overnight.  Pain is intermittent, controlled with medications.  2. Leukocytosis -  WBC count continues to remain elevated, no dramatic decrease noted.  Will reassess tomorrow after change in antibiotic therapy.  3. Anemia, chronic -due to iron deficiency. Continue on ferrous sulfate TID.  - Decrease in H/H  since admission (8.9), however now remaining stable at ~ 7. No evidence of external bleeding. Prior scan with no evidence of internal bleeding.  Possibly dilutional due to IVF.  Iron studies show very low iron levels (7).  If continues to decrease, will need to re-image. 4. Nausea with vomiting.  - One episode of vomiting after breakfast, persistent nausea.  Will change Zofran from prn to scheduled.  5. Fibroid uterus  - Asymptomatic per patient. No management necessary at this time.   Rubie Maid 06/25/2015, 12:41 PM

## 2015-06-26 ENCOUNTER — Inpatient Hospital Stay: Payer: MEDICAID

## 2015-06-26 LAB — COMPREHENSIVE METABOLIC PANEL
ALBUMIN: 2.2 g/dL — AB (ref 3.5–5.0)
ALT: 17 U/L (ref 14–54)
AST: 22 U/L (ref 15–41)
Alkaline Phosphatase: 118 U/L (ref 38–126)
Anion gap: 6 (ref 5–15)
BUN: 5 mg/dL — AB (ref 6–20)
CHLORIDE: 102 mmol/L (ref 101–111)
CO2: 26 mmol/L (ref 22–32)
CREATININE: 0.65 mg/dL (ref 0.44–1.00)
Calcium: 7.8 mg/dL — ABNORMAL LOW (ref 8.9–10.3)
GFR calc Af Amer: >60 mL/min (ref >60)
GLUCOSE: 112 mg/dL — AB (ref 65–99)
Potassium: 3.5 mmol/L (ref 3.5–5.1)
Sodium: 134 mmol/L — ABNORMAL LOW (ref 135–145)
TOTAL PROTEIN: 5.8 g/dL — AB (ref 6.5–8.1)
Total Bilirubin: 0.3 mg/dL (ref 0.3–1.2)

## 2015-06-26 LAB — CBC
HEMATOCRIT: 23.5 % — AB (ref 35.0–47.0)
Hemoglobin: 7.1 g/dL — ABNORMAL LOW (ref 12.0–16.0)
MCH: 18.4 pg — ABNORMAL LOW (ref 26.0–34.0)
MCHC: 30.2 g/dL — ABNORMAL LOW (ref 32.0–36.0)
MCV: 60.9 fL — AB (ref 80.0–100.0)
Platelets: 344 10*3/uL (ref 150–440)
RBC: 3.86 MIL/uL (ref 3.80–5.20)
RDW: 20.3 % — ABNORMAL HIGH (ref 11.5–14.5)
WBC: 11.1 10*3/uL — AB (ref 3.6–11.0)

## 2015-06-26 NOTE — Progress Notes (Addendum)
HD#4  Subjective: Patient reports that she "feels like something is there" in her pelvic region. Notes that she has pain, but is controlled with medications.  Notes that nausea has improved, no further episodes of vomiting.   Objective: I have reviewed patient's vital signs, intake and output, medications and labs.  Temp:  [97.8 F (36.6 C)-100.2 F (37.9 C)] 99.8 F (37.7 C) (03/27 0717) Pulse Rate:  [60-85] 85 (03/27 0717) Resp:  [18] 18 (03/27 0717) BP: (82-112)/(41-56) 96/51 mmHg (03/27 0717) SpO2:  [94 %-99 %] 94 % (03/27 0717)  General: alert and no distress Resp: clear to auscultation bilaterally Cardio: regular rate and rhythm, S1, S2 normal, no murmur, click, rub or gallop GI: normal findings: soft and no palpable masses and abnormal findings:  mild tenderness in the lower abdomen, more towards midline.  Pelvis: deferred. No vaginal bleeding.  Extremities: extremities normal, atraumatic, no cyanosis or edema  Labs:  CBC Latest Ref Rng 06/26/2015 06/25/2015 06/24/2015  WBC 3.6 - 11.0 K/uL 11.1(H) 14.8(H) 15.4(H)  Hemoglobin 12.0 - 16.0 g/dL 7.1(L) 7.0(L) 7.3(L)  Hematocrit 35.0 - 47.0 % 23.5(L) 23.6(L) 24.2(L)  Platelets 150 - 440 K/uL 344 305 284     Assessment/Plan: 1. Tubo-ovarian abscess - Changed antibiotic therapy to ampicillin/gentamycin/clindamycin yesterday afternoon. Response noted in decreased WBC count.  Will repeat ultrasound this morning to assess for any changes in abscess.  -  Patient remained afebrile again overnight (although 1 borderline temp noted).  Pain is intermittent, controlled with medications.  2. Leukocytosis -  WBC count has begun to decrease 3. Anemia, chronic -due to iron deficiency. Continue on ferrous sulfate TID.  - Decrease in H/H since admission (8.9), however now remaining stable at ~ 7. 4. Nausea with vomiting.  - Improved with scheduled Zofran 5. Fibroid uterus  - Asymptomatic per patient. No management necessary at this time.    Dispo:   Will likely d/c home tomorrow if WBC count continues to trend downward and better patient response to treatment noted.   Rubie Maid 06/26/2015, 9:17 AM

## 2015-06-27 DIAGNOSIS — N949 Unspecified condition associated with female genital organs and menstrual cycle: Secondary | ICD-10-CM

## 2015-06-27 DIAGNOSIS — R1900 Intra-abdominal and pelvic swelling, mass and lump, unspecified site: Secondary | ICD-10-CM

## 2015-06-27 LAB — CBC
HEMATOCRIT: 22.1 % — AB (ref 35.0–47.0)
HEMOGLOBIN: 6.8 g/dL — AB (ref 12.0–16.0)
MCH: 18.6 pg — ABNORMAL LOW (ref 26.0–34.0)
MCHC: 30.6 g/dL — ABNORMAL LOW (ref 32.0–36.0)
MCV: 60.7 fL — ABNORMAL LOW (ref 80.0–100.0)
Platelets: 360 10*3/uL (ref 150–440)
RBC: 3.64 MIL/uL — ABNORMAL LOW (ref 3.80–5.20)
RDW: 20.1 % — AB (ref 11.5–14.5)
WBC: 8.6 10*3/uL (ref 3.6–11.0)

## 2015-06-27 MED ORDER — IBUPROFEN 800 MG PO TABS: 800.0000 mg | ORAL_TABLET | Freq: Three times a day (TID) | ORAL | Status: DC | PRN

## 2015-06-27 MED ORDER — FERROUS SULFATE 325 (65 FE) MG PO TABS: 325.0000 mg | ORAL_TABLET | Freq: Three times a day (TID) | ORAL | Status: DC

## 2015-06-27 MED ORDER — ONDANSETRON HCL 4 MG PO TABS: 4.0000 mg | ORAL_TABLET | Freq: Three times a day (TID) | ORAL | Status: DC | PRN

## 2015-06-27 MED ORDER — LEVOFLOXACIN 500 MG PO TABS: 500.0000 mg | ORAL_TABLET | Freq: Every day | ORAL | Status: DC

## 2015-06-27 MED ORDER — DOCUSATE SODIUM 100 MG PO CAPS: 100.0000 mg | ORAL_CAPSULE | Freq: Two times a day (BID) | ORAL | Status: DC | PRN

## 2015-06-27 MED ORDER — METRONIDAZOLE 500 MG PO TABS: 500.0000 mg | ORAL_TABLET | Freq: Two times a day (BID) | ORAL | Status: DC

## 2015-06-27 NOTE — Progress Notes (Signed)
Pt discharged at this time via wheelchair escorted by NT3; pt going home with family

## 2015-06-27 NOTE — Discharge Summary (Signed)
Gynecology Physician Discharge Summary  Patient ID: Sherry Ward MRN: [id number] DOB/AGE: [birth year] 50 y.o.  Admit Date: 06/23/2015 Discharge Date: 06/27/2015  Admission Diagnoses: Left Tubo-Ovarian Abscess  Discharge Diagnoses:   Left Tubo-Ovarian Abscess, Fibroid Uterus, Iron Deficiency Anemia  Procedures/Imaging:  CT Scan, Pelvic Ultrasound  US Transvaginal/Pelvis Complete Non-ob  06/23/2015 CLINICAL DATA: Tubo-ovarian abscess EXAM: TRANSABDOMINAL AND TRANSVAGINAL ULTRASOUND OF PELVIS TECHNIQUE: Both transabdominal and transvaginal ultrasound examinations of the pelvis were performed. Transabdominal technique was performed for global imaging of the pelvis including uterus, ovaries, adnexal regions, and pelvic cul-de-sac. It was necessary to proceed with endovaginal exam following the transabdominal exam to visualize the endometrium and ovaries. COMPARISON: None FINDINGS: Uterus Measurements: 10.7 x 7.1 x 7.1 cm. Multiple hypoechoic uterine masses with the largest measuring 6.5 x 6.7 x 3.4 cm near the fundus. Complex cystic mass in the cervix measuring 2.1 x 1 x 2.3 cm likely representing a nabothian cyst. Endometrium Thickness: 14.7 mm. No focal abnormality visualized. Right ovary Measurements: 4.8 x 2.1 x 2.5 cm. Normal appearance/no adnexal mass. Left ovary Complex left adnexal mass measuring 7.6 x 3.2 x 3.8 cm with complex fluid in the left adnexal region concerning for a tubo-ovarian abscess. Other findings No abnormal free fluid. IMPRESSION: 1. Complex left adnexal mass measuring 7.6 x 3.2 x 3.8 cm with complex fluid in the left adnexal region concerning for a tubo-ovarian abscess. 2. Fibroid uterus. Electronically Signed By: Kathreen Devoid On: 06/23/2015 14:52   Ct Abdomen Pelvis W Contrast  06/23/2015 CLINICAL DATA: Left lower quadrant pain. Diarrhea for the past week. Elevated white blood cell count. EXAM: CT ABDOMEN AND PELVIS WITH CONTRAST TECHNIQUE: Multidetector CT  imaging of the abdomen and pelvis was performed using the standard protocol following bolus administration of intravenous contrast. CONTRAST: 192mL ISOVUE-300 IOPAMIDOL (ISOVUE-300) INJECTION 61% COMPARISON: 07/15/2012 FINDINGS: Respiratory motion artifact is present in the visualized lung bases with dependent subsegmental atelectasis bilaterally. There is no pleural effusion. The liver, gallbladder, spleen, adrenal glands, kidneys, and pancreas have an unremarkable enhanced appearance. There is no biliary dilatation. There is prominent inflammation in the left pelvis. There is a dilated fluid-filled tubular structure with prominent wall enhancement in the left adnexa which appears separate from colon and small bowel with extensive surrounding inflammatory fat stranding. This structure in total measures approximately 7 x 4 cm, with portions of the structure's wall at its anterosuperior aspect appearing less well-defined and potentially reflecting perforation. There is a small amount of fluid more medially in the pelvis along the superior margin of the uterus without well-defined rim enhancement. Mild hyperenhancement of several adjacent loops of small bowel in this region is likely reactive due to the adjacent inflammation rather than reflecting primary small bowel pathology. There is no evidence of bowel obstruction. Small amount of partially loculated fluid extends into the right adnexal region with mild hyperenhancement in the region of the right ovary/tube. The uterus is enlarged by numerous fibroids, with the largest measuring 7.0 x 4.0 cm at the fundus and having mildly enlarged in the interim. A 7 mm left pelvic sidewall lymph node is slightly larger than on the prior CT and likely reactive. The aorta is normal in caliber. No acute osseous abnormality is identified. IMPRESSION: Extensive inflammatory process in the pelvis with dilated tubular structure in the left adnexa most concerning for PID with  pyosalpinx and tubo-ovarian abscess. Electronically Signed By: Logan Bores M.D. On: 06/23/2015 12:31   Labs:  Results for orders placed or performed during the hospital  encounter of 06/23/15  [place] rt PCR  Result Value Ref Range   Specimen source GC/Chlam VAGINA    Chlamydia Tr NOT DETECTED NOT DETECTED   N gonorrhoeae NOT DETECTED NOT DETECTED  Culture, blood (routine x 2)  Result Value Ref Range   Specimen Description BLOOD LEFT ANTECUBITAL    Special Requests BOTTLES DRAWN AEROBIC AND ANAEROBIC  1CC    Culture NO GROWTH 5 DAYS    Report Status 06/28/2015 FINAL   Culture, blood (routine x 2)  Result Value Ref Range   Specimen Description BLOOD LEFT ANTECUBITAL    Special Requests BOTTLES DRAWN AEROBIC AND ANAEROBIC  1CC    Culture NO GROWTH 5 DAYS    Report Status 06/28/2015 FINAL   Urine culture  Result Value Ref Range   Specimen Description URINE, RANDOM    Special Requests NONE    Culture MULTIPLE SPECIES PRESENT, SUGGEST RECOLLECTION    Report Status 06/25/2015 FINAL   Comprehensive metabolic panel  Result Value Ref Range   Sodium 134 (L) 135 - 145 mmol/L   Potassium 3.6 3.5 - 5.1 mmol/L   Chloride 102 101 - 111 mmol/L   CO2 22 22 - 32 mmol/L   Glucose, Bld 124 (H) 65 - 99 mg/dL   BUN 10 6 - 20 mg/dL   Creatinine, Ser 0.77 0.44 - 1.00 mg/dL   Calcium 8.7 (L) 8.9 - 10.3 mg/dL   Total Protein 7.7 6.5 - 8.1 g/dL   Albumin 3.4 (L) 3.5 - 5.0 g/dL   AST 43 (H) 15 - 41 U/L   ALT 40 14 - 54 U/L   Alkaline Phosphatase 223 (H) 38 - 126 U/L   Total Bilirubin 0.8 0.3 - 1.2 mg/dL   GFR calc non Af Amer >60 >60 mL/min   GFR calc Af Amer >60 >60 mL/min   Anion gap 10 5 - 15  Lipase, blood  Result Value Ref Range   Lipase 16 11 - 51 U/L  CBC with Differential  Result Value Ref Range   WBC 15.0 (H) 3.6 - 11.0 K/uL   RBC 4.77 3.80 - 5.20 MIL/uL   Hemoglobin 8.9 (L) 12.0 - 16.0 g/dL   HCT 29.1 (L) 35.0 - 47.0 %   MCV 61.0 (L) 80.0 - 100.0 fL   MCH 18.6 (L) 26.0  - 34.0 pg   MCHC 30.5 (L) 32.0 - 36.0 g/dL   RDW 19.6 (H) 11.5 - 14.5 %   Platelets 282 150 - 440 K/uL   Neutrophils Relative % 92 %   Neutro Abs 13.7 (H) 1.4 - 6.5 K/uL   Lymphocytes Relative 5 %   Lymphs Abs 0.7 (L) 1.0 - 3.6 K/uL   Monocytes Relative 3 %   Monocytes Absolute 0.5 0.2 - 0.9 K/uL   Eosinophils Relative 0 %   Eosinophils Absolute 0.0 0 - 0.7 K/uL   Basophils Relative 0 %   Basophils Absolute 0.0 0 - 0.1 K/uL  Urinalysis complete, with microscopic  Result Value Ref Range   Color, Urine YELLOW (A) YELLOW   APPearance CLEAR (A) CLEAR   Glucose, UA NEGATIVE NEGATIVE mg/dL   Bilirubin Urine NEGATIVE NEGATIVE   Ketones, ur TRACE (A) NEGATIVE mg/dL   Specific Gravity, Urine >1.060 (H) 1.005 - 1.030   Hgb urine dipstick NEGATIVE NEGATIVE   pH 5.0 5.0 - 8.0   Protein, ur NEGATIVE NEGATIVE mg/dL   Nitrite NEGATIVE NEGATIVE   Leukocytes, UA NEGATIVE NEGATIVE   RBC / HPF 0-5 0 -  5 RBC/hpf   WBC, UA 0-5 0 - 5 WBC/hpf   Bacteria, UA NONE SEEN NONE SEEN   Squamous Epithelial / LPF 0-5 (A) NONE SEEN   Mucous PRESENT   RPR  Result Value Ref Range   RPR Ser Ql Non Reactive Non Reactive  HIV antibody  Result Value Ref Range   HIV Screen 4th Generation wRfx Non Reactive Non Reactive  Lactic acid, plasma  Result Value Ref Range   Lactic Acid, Venous 0.9 0.5 - 2.0 mmol/L  hCG, quantitative, pregnancy  Result Value Ref Range   hCG, Beta Chain, Quant, S 1 <5 mIU/mL  Rapid HIV screen (HIV 1/2 Ab+Ag)  Result Value Ref Range   HIV-1 P24 Antigen - HIV24 NON REACTIVE NON REACTIVE   HIV 1/2 Antibodies NON REACTIVE NON REACTIVE   Interpretation (HIV Ag Ab)      A non reactive test result means that HIV 1 or HIV 2 antibodies and HIV 1 p24 antigen were not detected in the specimen.  CBC WITH DIFFERENTIAL  Result Value Ref Range   WBC 14.8 (H) 3.6 - 11.0 K/uL   RBC 3.84 3.80 - 5.20 MIL/uL   Hemoglobin 7.1 (L) 12.0 - 16.0 g/dL   HCT 23.7 (L) 35.0 - 47.0 %   MCV 61.6 (L) 80.0 -  100.0 fL   MCH 18.4 (L) 26.0 - 34.0 pg   MCHC 29.9 (L) 32.0 - 36.0 g/dL   RDW 20.0 (H) 11.5 - 14.5 %   Platelets 246 150 - 440 K/uL   Neutrophils Relative % 91% %   Neutro Abs 13.4 (H) 1.4 - 6.5 K/uL   Lymphocytes Relative 5% %   Lymphs Abs 0.8 (L) 1.0 - 3.6 K/uL   Monocytes Relative 4% %   Monocytes Absolute 0.5 0.2 - 0.9 K/uL   Eosinophils Relative 0% %   Eosinophils Absolute 0.0 0 - 0.7 K/uL   Basophils Relative 0% %   Basophils Absolute 0.0 0 - 0.1 K/uL  CBC with Differential/Platelet  Result Value Ref Range   WBC 15.4 (H) 3.6 - 11.0 K/uL   RBC 3.89 3.80 - 5.20 MIL/uL   Hemoglobin 7.3 (L) 12.0 - 16.0 g/dL   HCT 24.2 (L) 35.0 - 47.0 %   MCV 62.3 (L) 80.0 - 100.0 fL   MCH 18.9 (L) 26.0 - 34.0 pg   MCHC 30.3 (L) 32.0 - 36.0 g/dL   RDW 19.7 (H) 11.5 - 14.5 %   Platelets 284 150 - 440 K/uL   Neutrophils Relative % 91 %   Neutro Abs 14.0 (H) 1.4 - 6.5 K/uL   Lymphocytes Relative 6 %   Lymphs Abs 0.9 (L) 1.0 - 3.6 K/uL   Monocytes Relative 3 %   Monocytes Absolute 0.4 0.2 - 0.9 K/uL   Eosinophils Relative 0 %   Eosinophils Absolute 0.0 0 - 0.7 K/uL   Basophils Relative 0 %   Basophils Absolute 0.0 0 - 0.1 K/uL  Iron and TIBC  Result Value Ref Range   Iron 7 (L) 28 - 170 ug/dL   TIBC 269 250 - 450 ug/dL   Saturation Ratios 3 (L) 10.4 - 31.8 %   UIBC 262 ug/dL  Ferritin  Result Value Ref Range   Ferritin 54 11 - 307 ng/mL  Reticulocytes  Result Value Ref Range   Retic Ct Pct 1.8 0.4 - 3.1 %   RBC. 3.89 3.80 - 5.20 MIL/uL   Retic Count, Manual 70.0 19.0 - 183.0 K/uL  CBC  Result Value Ref Range   WBC 14.8 (H) 3.6 - 11.0 K/uL   RBC 3.83 3.80 - 5.20 MIL/uL   Hemoglobin 7.0 (L) 12.0 - 16.0 g/dL   HCT 23.6 (L) 35.0 - 47.0 %   MCV 61.5 (L) 80.0 - 100.0 fL   MCH 18.3 (L) 26.0 - 34.0 pg   MCHC 29.8 (L) 32.0 - 36.0 g/dL   RDW 20.1 (H) 11.5 - 14.5 %   Platelets 305 150 - 440 K/uL  CBC  Result Value Ref Range   WBC 11.1 (H) 3.6 - 11.0 K/uL   RBC 3.86 3.80 - 5.20 MIL/uL    Hemoglobin 7.1 (L) 12.0 - 16.0 g/dL   HCT 23.5 (L) 35.0 - 47.0 %   MCV 60.9 (L) 80.0 - 100.0 fL   MCH 18.4 (L) 26.0 - 34.0 pg   MCHC 30.2 (L) 32.0 - 36.0 g/dL   RDW 20.3 (H) 11.5 - 14.5 %   Platelets 344 150 - 440 K/uL  Comprehensive metabolic panel  Result Value Ref Range   Sodium 134 (L) 135 - 145 mmol/L   Potassium 3.5 3.5 - 5.1 mmol/L   Chloride 102 101 - 111 mmol/L   CO2 26 22 - 32 mmol/L   Glucose, Bld 112 (H) 65 - 99 mg/dL   BUN 5 (L) 6 - 20 mg/dL   Creatinine, Ser 0.65 0.44 - 1.00 mg/dL   Calcium 7.8 (L) 8.9 - 10.3 mg/dL   Total Protein 5.8 (L) 6.5 - 8.1 g/dL   Albumin 2.2 (L) 3.5 - 5.0 g/dL   AST 22 15 - 41 U/L   ALT 17 14 - 54 U/L   Alkaline Phosphatase 118 38 - 126 U/L   Total Bilirubin 0.3 0.3 - 1.2 mg/dL   GFR calc non Af Amer >60 >60 mL/min   GFR calc Af Amer >60 >60 mL/min   Anion gap 6 5 - 15  CBC  Result Value Ref Range   WBC 8.6 3.6 - 11.0 K/uL   RBC 3.64 (L) 3.80 - 5.20 MIL/uL   Hemoglobin 6.8 (L) 12.0 - 16.0 g/dL   HCT 22.1 (L) 35.0 - 47.0 %   MCV 60.7 (L) 80.0 - 100.0 fL   MCH 18.6 (L) 26.0 - 34.0 pg   MCHC 30.6 (L) 32.0 - 36.0 g/dL   RDW 20.1 (H) 11.5 - 14.5 %   Platelets 360 150 - 440 K/uL  Pregnancy, urine POC  Result Value Ref Range   Preg Test, Ur NEGATIVE NEGATIVE     Hospital Course:  Sherry Ward is a 50 y.o. [id number] Hispanic female who was admitted from the Emergency Room for treatment of left tubo-ovarian abscess with low grade fever and nausea.  She was started on antibiotics (IV Cefoxitin and PO Doxycyline).  By HD# 3, minimal response had been noted on antibiotic regimen and so was changed to IV Ampicillin, Gentamycin, and Clindamycin.  By time of discharge on POD#5, her pain was controlled on oral pain medications and her leukocytosis resolved; Of note, patient was diagnosed with fibroid uterus on admission (patient previously unaware) and moderate anemia (asymptomatic).  She was treated with PO iron supplementation.  At time  of discharge she was ambulating, voiding without difficulty, and tolerating a regular diet. She was deemed stable for discharge to home.    Discharge Exam: Blood pressure 95/54, pulse 81, temperature 99.2 F (37.3 C), temperature source Oral, resp. rate 18, height 5\' 5"  (1.651 m), weight 140 lb (63.504 kg),  SpO2 97 %. General appearance: alert and no distress  Resp: clear to auscultation bilaterally  Cardio: regular rate and rhythm  GI: soft, mildly tender; bowel sounds normal; no masses, no organomegaly.  Pelvic: no bleeding, enlarged uterus ~ 16 week size.   Extremities: extremities normal, atraumatic, no cyanosis or edema and Homans sign is negative, no sign of DVT  Discharged Condition: Stable  Disposition: 01-Home or Self Care     Medication List    TAKE these medications        docusate sodium 100 MG capsule  Commonly known as:  COLACE  Take 1 capsule (100 mg total) by mouth 2 (two) times daily as needed for mild constipation.     ferrous sulfate 325 (65 FE) MG tablet  Take 1 tablet (325 mg total) by mouth 3 (three) times daily with meals.     ibuprofen 800 MG tablet  Commonly known as:  ADVIL,MOTRIN  Take 1 tablet (800 mg total) by mouth every 8 (eight) hours as needed.     levofloxacin 500 MG tablet  Commonly known as:  LEVAQUIN  Take 1 tablet (500 mg total) by mouth daily.     metroNIDAZOLE 500 MG tablet  Commonly known as:  FLAGYL  Take 1 tablet (500 mg total) by mouth 2 (two) times daily.     ondansetron 4 MG tablet  Commonly known as:  ZOFRAN  Take 1 tablet (4 mg total) by mouth every 8 (eight) hours as needed for nausea or vomiting.           Follow-up Information    Follow up with Rubie Maid, MD In 2 weeks.   Specialties:  Obstetrics and Gynecology, Radiology   Why:  f/u tubo-ovarian abscess.   Contact information:   [place] [address] [phone]       Signed:  Rubie Maid, MD [facility]

## 2015-06-27 NOTE — Discharge Instructions (Signed)
Quiste ovrico (Ovarian Cyst) Un quiste ovrico es un saco lleno de lquido o [place]. Este saco est unido al ovario. Algunos quistes desaparecen solos. Otros quistes necesitan tratamiento.  CUIDADOS EN EL HOGAR   Solo tome los medicamentos que le haya indicado su mdico.  Concurra a las consultas de control con el mdico, segn las indicaciones.  Hgase exmenes plvicos regulares y pruebas de Papanicolaou. SOLICITE AYUDA SI:  Sus perodos se atrasan o son irregulares o dolorosos.  Sus perodos cesan.  Siente dolor en el vientre (abdominal) o en la pelvis que no desaparece.  El abdomen se agranda o se inflama hincha.  Tiene dificultades para orinar (vaciar por completo la vejiga).  Siente presin en la vejiga.  Siente dolor durante las [employer].  Siente distensin, presin o Manufacturing systems engineer.  Pierde peso sin ningn motivo.  Se siente mal constantemente.  Tiene dificultades para defecar (estreimiento).  No tiene ganas de comer.  Le aparecen granos (acn).  Nota un aumento del vello corporal y facial.  [place]oney Harada de peso sin motivo.  Sospecha que est embarazada. SOLICITE AYUDA DE INMEDIATO SI:   El dolor en el vientre empeora.  Tiene Higher education careers adviser (nuseas) y vomita.  Le sube la fiebre rpidamente.  Le duele el estmago mientras defeca.  Los perodos menstruales son ms abundantes de lo habitual. ASEGRESE DE QUE:   Comprende estas instrucciones.  Controlar su afeccin.  Recibir ayuda de inmediato si no mejora o si empeora.   Esta informacin no tiene Marine scientist el consejo del mdico. Asegrese de hacerle al mdico cualquier pregunta que tenga.   Document Released: 01/06/2013 Elsevier Interactive Patient Education [employer].

## 2015-06-27 NOTE — Progress Notes (Addendum)
HD#5  Subjective: Patient reports that she is beginning to feel better. Notes that she still has intermittent pain, but is controlled with medications.  Notes that nausea has improved, no further episodes of vomiting.  Denies fevers and chills.   Objective: I have reviewed patient's vital signs, intake and output, medications and labs.  Temp:  [98.6 F (37 C)-100.9 F (38.3 C)] 99.2 F (37.3 C) (03/28 0727) Pulse Rate:  [73-85] 81 (03/28 0727) Resp:  [18] 18 (03/28 0727) BP: (81-106)/(45-56) 95/54 mmHg (03/28 0727) SpO2:  [96 %-99 %] 97 % (03/28 0727)  General: alert and no distress Resp: clear to auscultation bilaterally Cardio: regular rate and rhythm, S1, S2 normal, no murmur, click, rub or gallop GI: normal findings: soft and no palpable masses and nontender.  Pelvis: deferred. No vaginal bleeding.  Extremities: extremities normal, atraumatic, no cyanosis or edema  Labs:  CBC Latest Ref Rng 06/27/2015 06/26/2015 06/25/2015  WBC 3.6 - 11.0 K/uL 8.6 11.1(H) 14.8(H)  Hemoglobin 12.0 - 16.0 g/dL 6.8(L) 7.1(L) 7.0(L)  Hematocrit 35.0 - 47.0 % 22.1(L) 23.5(L) 23.6(L)  Platelets 150 - 440 K/uL 360 344 305     Imaging (Pelvic Ultrasound 06/26/2015):   FINDINGS: Uterus  Measurements: 13.2 x 5.5 x 6.9 cm. Multiple fibroids are demonstrated. There is a 4.2 x 4.3 x 4.2 cm intramural fibroid within the lower uterine segment. There is a 3.8 x 3.5 x 4.6 cm partially exophytic fibroid off the uterine fundus. Multiple complicated nabothian cysts.  Endometrium  Thickness: 11.4 mm. No focal abnormality visualized.  Right ovary  Measurements: 2.2 x 3.7 x 2.8 cm. Poorly visualized.  Left ovary  Within the left adnexa there is a 6.4 x 5.9 x 3.1 cm complex solid and cystic mass with increased vascularity. The ovary is difficult to discern within this mass.  Other findings  Small amount of free fluid in the pelvis.  IMPRESSION: There is a complex solid and cystic mass  within the left adnexal with increased vascularity concerning for tubo-ovarian abscess. Recommend continued clinical evaluation for improvement in symptomatology and laboratory results. A short-term follow-up ultrasound is recommended to assess for interval improvement in the complex left adnexal mass. Should this persist over time, this would need dedicated evaluation with MRI given the complexity in appearance to exclude the possibility of underlying neoplasm.  Assessment/Plan: 1. Tubo-ovarian abscess - Current antibiotic therapy is ampicillin/gentamycin/clindamycin. Response noted in continued decreased WBC count. Will change to PO Levaquin and Flagyl at discharge.  Patient remains afebrile. Pain is intermittent, controlled with medications.  2. Leukocytosis -  WBC continues to decrease, is back to normal range 3. Anemia, chronic -due to iron deficiency. Continue on ferrous sulfate TID.  - Decrease in H/H since admission (8.9), however now remaining stable at ~ 7. 4. Nausea with vomiting.  - Improved with Zofran, will d/c home with medication.  5. Fibroid uterus  - Asymptomatic per patient. Denies h/o heavy menses. No management necessary at this time.   Dispo:   Will d/c home today. Will f/u in clinic in 2 weeks, and have repeat ultrasound to reassess abscess.    Rubie Maid 06/27/2015, 8:13 AM

## 2015-06-28 LAB — CULTURE, BLOOD (ROUTINE X 2)
CULTURE: NO GROWTH
CULTURE: NO GROWTH

## 2015-07-04 ENCOUNTER — Other Ambulatory Visit: Payer: Self-pay | Admitting: Obstetrics and Gynecology

## 2015-07-04 DIAGNOSIS — N7093 Salpingitis and oophoritis, unspecified: Secondary | ICD-10-CM

## 2015-07-05 ENCOUNTER — Encounter: Payer: Self-pay | Admitting: Obstetrics and Gynecology

## 2015-07-05 ENCOUNTER — Ambulatory Visit (INDEPENDENT_AMBULATORY_CARE_PROVIDER_SITE_OTHER): Payer: Self-pay | Admitting: Obstetrics and Gynecology

## 2015-07-05 ENCOUNTER — Ambulatory Visit (INDEPENDENT_AMBULATORY_CARE_PROVIDER_SITE_OTHER): Payer: Self-pay

## 2015-07-05 VITALS — BP 102/69 | HR 101 | Ht 60.0 in | Wt 132.0 lb

## 2015-07-05 DIAGNOSIS — N838 Other noninflammatory disorders of ovary, fallopian tube and broad ligament: Secondary | ICD-10-CM

## 2015-07-05 DIAGNOSIS — N920 Excessive and frequent menstruation with regular cycle: Secondary | ICD-10-CM

## 2015-07-05 DIAGNOSIS — D500 Iron deficiency anemia secondary to blood loss (chronic): Secondary | ICD-10-CM

## 2015-07-05 DIAGNOSIS — D259 Leiomyoma of uterus, unspecified: Secondary | ICD-10-CM

## 2015-07-05 DIAGNOSIS — R102 Pelvic and perineal pain: Secondary | ICD-10-CM

## 2015-07-05 DIAGNOSIS — N7093 Salpingitis and oophoritis, unspecified: Secondary | ICD-10-CM

## 2015-07-05 NOTE — Patient Instructions (Signed)
Usted est programado para la ciruga el 17/07/2015. Nada que comer despus de la medianoche del da anterior a la [place]. No tome ningn medicamento a menos que lo recomiende su proveedor el da antes de la [place]. No tome AINEs (Motrin, Aleve) o aspirina 7 das antes de la [place]. Usted puede tomar productos Tylenol para dolores y [employer]. Recibir una receta para medicamentos para el dolor despus de la operacin. Se le comunicar por telfono aproximadamente una semana antes de la ciruga para programar la cita preoperatoria. Por favor llame a la oficina si tiene alguna pregunta con respecto a su prxima ciruga.

## 2015-07-08 ENCOUNTER — Encounter: Payer: Self-pay | Admitting: Obstetrics and Gynecology

## 2015-07-08 NOTE — H&P (Signed)
GYNECOLOGY PRE-OPERATIVE HISTORY AND PHYSICAL  Subjective:    Patient is a 50 y.o. [id number] female scheduled for TAH/RSO/left salpingectomy. Indications for procedure are severe anemia, fibroid uterus, persistent right adnexal mass.   Pertinent Gynecological History: Menses: flow is excessive with use of 6-7 pads or tampons on heaviest days and regular every 28 days without intermenstrual spotting Contraception: none Last mammogram: patient cannot recall last mammogram  Last pap: normal Date: over 3 years ago.  Discussed Blood/Blood Products: yes   Menstrual History: Menarche age: 89  Patient's last menstrual period was 03/06/2015.     OB History  Gravida Para Term Preterm AB SAB TAB Ectopic Multiple Living  3 3 3       2     # Outcome Date GA Lbr Len/2nd Weight Sex Delivery Anes PTL Lv  3 Term  [redacted]w[redacted]d    CS-LTranv   Y  2 Term  [redacted]w[redacted]d    VBAC   Y  1 Term  [redacted]w[redacted]d    CS-LTranv  N FD      Past Medical History  Diagnosis Date  . Anemia   . Fibroid uterus 06/24/2015    Past Surgical History  Procedure Laterality Date  . Cesarean section      x2    History reviewed. No pertinent family history.   Social History   Social History  . Marital Status: Married    Spouse Name: N/A  . Number of Children: N/A  . Years of Education: N/A   Social History Main Topics  . Smoking status: Never Smoker   . Smokeless tobacco: None  . Alcohol Use: No  . Drug Use: No  . Sexual Activity: Yes    Birth Control/ Protection: None   Other Topics Concern  . None   Social History Narrative    Current Outpatient Prescriptions on File Prior to Visit  Medication Sig Dispense Refill  . docusate sodium (COLACE) 100 MG capsule Take 1 capsule (100 mg total) by mouth 2 (two) times daily as needed for mild constipation. 60 capsule 2  . ferrous sulfate 325 (65 FE) MG tablet Take 1 tablet (325 mg total) by mouth 3 (three) times daily with meals. 90 tablet 3  . ibuprofen (ADVIL,MOTRIN)  800 MG tablet Take 1 tablet (800 mg total) by mouth every 8 (eight) hours as needed. 60 tablet 1  . levofloxacin (LEVAQUIN) 500 MG tablet Take 1 tablet (500 mg total) by mouth daily. 7 tablet 0  . metroNIDAZOLE (FLAGYL) 500 MG tablet Take 1 tablet (500 mg total) by mouth 2 (two) times daily. 14 tablet 0  . ondansetron (ZOFRAN) 4 MG tablet Take 1 tablet (4 mg total) by mouth every 8 (eight) hours as needed for nausea or vomiting. 30 tablet 0   No current facility-administered medications on file prior to visit.    No Known Allergies  Review of Systems Constitutional: No recent fever/chills/sweats Respiratory: No recent cough/bronchitis Cardiovascular: No chest pain Gastrointestinal: No recent nausea/vomiting/diarrhea Genitourinary: No UTI symptoms Hematologic/lymphatic:No history of coagulopathy or recent blood thinner use    Objective:    BP 102/69 mmHg  Pulse 101  Ht 5' (1.524 m)  Wt 132 lb (59.875 kg)  BMI 25.78 kg/m2  LMP 03/06/2015  General:   Normal  Skin:   normal  HEENT:  Normal  Neck:  Supple without Adenopathy or Thyromegaly  Lungs:   Heart:              Breasts:   Abdomen:  Pelvis:  M/S   Extremeties:  Neuro:    clear to auscultation bilaterally   Normal without murmur   Not Examined   soft,mildly tender on right and suprapubic; bowel sounds normal; mass present midway between pubic symphysis and umbilicus,  no organomegaly   Exam deferred to OR  No CVAT  Warm/Dry   Normal           Labs:  Lab Results  Component Value Date   WBC 8.6 06/27/2015   HGB 6.8* 06/27/2015   HCT 22.1* 06/27/2015   MCV 60.7* 06/27/2015   PLT 360 06/27/2015     Imaging:  US Transvaginal/Pelvis Complete Non-ob  06/23/2015 CLINICAL DATA: Tubo-ovarian abscess EXAM: TRANSABDOMINAL AND TRANSVAGINAL ULTRASOUND OF PELVIS TECHNIQUE: Both transabdominal and transvaginal ultrasound examinations of the pelvis were performed. Transabdominal technique was performed for global  imaging of the pelvis including uterus, ovaries, adnexal regions, and pelvic cul-de-sac. It was necessary to proceed with endovaginal exam following the transabdominal exam to visualize the endometrium and ovaries. COMPARISON: None FINDINGS: Uterus Measurements: 10.7 x 7.1 x 7.1 cm. Multiple hypoechoic uterine masses with the largest measuring 6.5 x 6.7 x 3.4 cm near the fundus. Complex cystic mass in the cervix measuring 2.1 x 1 x 2.3 cm likely representing a nabothian cyst. Endometrium Thickness: 14.7 mm. No focal abnormality visualized. Right ovary Measurements: 4.8 x 2.1 x 2.5 cm. Normal appearance/no adnexal mass. Left ovary Complex left adnexal mass measuring 7.6 x 3.2 x 3.8 cm with complex fluid in the left adnexal region concerning for a tubo-ovarian abscess. Other findings No abnormal free fluid. IMPRESSION: 1. Complex left adnexal mass measuring 7.6 x 3.2 x 3.8 cm with complex fluid in the left adnexal region concerning for a tubo-ovarian abscess. 2. Fibroid uterus. Electronically Signed By: Kathreen Devoid On: 06/23/2015 14:52     US Transvaginal/Pelvis Complete Non-ob Date of Service: 07/05/15 Indications: Follow Up of probable tubo-ovarian abscess from 06/26/15 Findings:  The uterus measures 11.0 x 5.9 x 7.0 cm. Echo texture is diffusely heterogenous with evidence of focal masses. Within the uterus are multiple suspected fibroids measuring: Fibroid 1: 4.1 x 4.8 x 4.0 cm. Left fundal, intramural. Fibroid 2: 3.4 x 4.8 x 4.5 cm. Right anterior fundal, partially exophytic.  The Endometrium is ill defined, slightly echogenic and measures 9.9 mm.  Right Ovary measures 4.2 x 3.0 x 2.9 cm. There is a simple appearing cyst measuring 2.6 cm at its greatest dimension. Left adnexa area is surveyed and reveals a complex solid as well as cystic mass measuring 7.0 x 4.1 x 4.4 cm with internal vascular flow noted. This is unchanged to just slightly larger than prior ultrasound of 06/26/15. The left  ovary is not definitely visualized.  There does not appear to be any free fluid in the cul de sac.  Impression: 1. Fibroid uterus. 2. Mass in the left adnexa again is seen. Stable, to just slightly increased to imaging of 06/26/15.   Assessment:    Persistent Right ovarian mass (tubo-ovarian abscess) Fibroid uterus Anemia (due to chronic blood loss) Heavy menses (however has not had cycle since December)  Plan:   - Discussion had that since antibiotic therapy has failed to cause improvement/resolution of suspected right tubo-ovarian abscess, recommendation at this time is for surgical intervention with removal of the mass. Patient notes understanding and is ok with plan.  - Further discussion had with patient regarding management fibroid uterus and heavy menses causing chronic anemia. Patient had been relatively asymptomatic, and was  unaware of her fibroids or anemia until recent admission. Discussed management options, including hormonal therapy (Mirena IUD, progesterone OCPs, Depot Lupron, endometrial ablation, or hysterectomy. Patient desires hysterectomy. The risks of surgery were discussed in detail with the patient including but not limited to: bleeding which may require transfusion or reoperation; infection which may require prolonged hospitalization or re-hospitalization and antibiotic therapy; injury to bowel, bladder, ureters and major vessels or other surrounding organs; need for additional procedures including laparotomy; thromboembolic phenomenon, incisional problems and other postoperative or anesthesia complications. Patient was told that the likelihood that her condition and symptoms will be treated effectively with this surgical management was very high; the postoperative expectations were also discussed in detail. All questions were answered. She was told that she will be contacted by our surgical scheduler regarding the time and date of her surgery; routine preoperative  instructions of having nothing to eat or drink after midnight on the day prior to surgery and also coming to the hospital 1.5 hours prior to her time of surgery were also emphasized. She was told she may be called for a preoperative appointment about a week prior to surgery and will be given further preoperative instructions at that visit. Printed patient education handouts about the procedure were given to the patient to review at home. Discussion had on preservation vs removal of normal ovary, including risks and benefits. Patient desires to keep normal ovary. Patient scheduled for 07/17/2015 for TAH, RSO, left salpingectomy.  - Discussion had on potential need for blood transfusion prior to, during, or after surgical procedure.  Patient notes understanding. Will be consented for blood products.     Rubie Maid, MD [facility]

## 2015-07-08 NOTE — Progress Notes (Signed)
GYNECOLOGY PROGRESS NOTE  Subjective:    Patient ID: Sherry Ward, female    DOB: [birth year], 50 y.o.   MRN: [id number]  HPI  Patient is a 50 y.o. G37P3002 female who presents for f/u after ultrasound for right pelvic abscess.  Was hospitalized 2 weeks ago and treated with IV antibiotics x 5 days. Was discharged with PO antibiotics.  Today still notes right sided pain that radiates through entire lower abdomen and abdominal bloating. Denies fevers or chills.    Past Medical History  Diagnosis Date  . Anemia   . Fibroid uterus 06/24/2015    History reviewed. No pertinent family history.    Past Surgical History  Procedure Laterality Date  . Cesarean section      x2    Social History  Substance Use Topics  . Smoking status: Never Smoker   . Smokeless tobacco: None  . Alcohol Use: No     Current Outpatient Prescriptions on File Prior to Visit  Medication Sig Dispense Refill  . docusate sodium (COLACE) 100 MG capsule Take 1 capsule (100 mg total) by mouth 2 (two) times daily as needed for mild constipation. 60 capsule 2  . ferrous sulfate 325 (65 FE) MG tablet Take 1 tablet (325 mg total) by mouth 3 (three) times daily with meals. 90 tablet 3  . ibuprofen (ADVIL,MOTRIN) 800 MG tablet Take 1 tablet (800 mg total) by mouth every 8 (eight) hours as needed. 60 tablet 1  . levofloxacin (LEVAQUIN) 500 MG tablet Take 1 tablet (500 mg total) by mouth daily. 7 tablet 0  . metroNIDAZOLE (FLAGYL) 500 MG tablet Take 1 tablet (500 mg total) by mouth 2 (two) times daily. 14 tablet 0  . ondansetron (ZOFRAN) 4 MG tablet Take 1 tablet (4 mg total) by mouth every 8 (eight) hours as needed for nausea or vomiting. 30 tablet 0   No current facility-administered medications on file prior to visit.    No Known Allergies   Review of Systems Pertinent items noted in HPI and remainder of comprehensive ROS otherwise negative.   Objective:   Blood pressure 102/69, pulse 101, height 5'  (1.524 m), weight 132 lb (59.875 kg), last menstrual period 03/06/2015. General appearance: alert and no distress Abdomen: normal findings: soft and abnormal findings:  mass, located in the lower abdomen and mild tenderness in the lower abdomen Pelvic: deferred Extremities: extremities normal, atraumatic, no cyanosis or edema Neurologic: Grossly normal    Imaging:  US Transvaginal/Pelvis Complete Non-ob  06/23/2015 CLINICAL DATA: Tubo-ovarian abscess EXAM: TRANSABDOMINAL AND TRANSVAGINAL ULTRASOUND OF PELVIS TECHNIQUE: Both transabdominal and transvaginal ultrasound examinations of the pelvis were performed. Transabdominal technique was performed for global imaging of the pelvis including uterus, ovaries, adnexal regions, and pelvic cul-de-sac. It was necessary to proceed with endovaginal exam following the transabdominal exam to visualize the endometrium and ovaries. COMPARISON: None FINDINGS: Uterus Measurements: 10.7 x 7.1 x 7.1 cm. Multiple hypoechoic uterine masses with the largest measuring 6.5 x 6.7 x 3.4 cm near the fundus. Complex cystic mass in the cervix measuring 2.1 x 1 x 2.3 cm likely representing a nabothian cyst. Endometrium Thickness: 14.7 mm. No focal abnormality visualized. Right ovary Measurements: 4.8 x 2.1 x 2.5 cm. Normal appearance/no adnexal mass. Left ovary Complex left adnexal mass measuring 7.6 x 3.2 x 3.8 cm with complex fluid in the left adnexal region concerning for a tubo-ovarian abscess. Other findings No abnormal free fluid. IMPRESSION: 1. Complex left adnexal mass measuring 7.6 x 3.2  x 3.8 cm with complex fluid in the left adnexal region concerning for a tubo-ovarian abscess. 2. Fibroid uterus. Electronically Signed By: Kathreen Devoid On: 06/23/2015 14:52    ULTRASOUND REPORT Location: [facility] Date of Service: 07/05/15  Indications: Follow Up of probable tubo-ovarian abscess from 06/26/15 Findings:  The uterus measures 11.0 x 5.9 x 7.0  cm. Echo texture is diffusely heterogenous with evidence of focal masses. Within the uterus are multiple suspected fibroids measuring: Fibroid 1: 4.1 x 4.8 x 4.0 cm. Left fundal, intramural. Fibroid 2: 3.4 x 4.8 x 4.5 cm. Right anterior fundal, partially exophytic.  The Endometrium is ill defined, slightly echogenic and measures 9.9 mm.  Right Ovary measures 4.2 x 3.0 x 2.9 cm. There is a simple appearing cyst measuring 2.6 cm at its greatest dimension. Left adnexa area is surveyed and reveals a complex solid as well as cystic mass measuring 7.0 x 4.1 x 4.4 cm with internal vascular flow noted. This is unchanged to just slightly larger than prior ultrasound of 06/26/15. The left ovary is not definitely visualized.  There does not appear to be any free fluid in the cul de sac.  Impression: 1. Fibroid uterus. 2. Mass in the left adnexa again is seen. Stable, to just slightly increased to imaging of 06/26/15.    Labs:   Lab Results  Component Value Date   WBC 8.6 06/27/2015   HGB 6.8* 06/27/2015   HCT 22.1* 06/27/2015   MCV 60.7* 06/27/2015   PLT 360 06/27/2015    Assessment:   Right ovarian mass (tubo-ovarian abscess) Fibroid uterus Anemia (due to chronic blood loss) Heavy menses (however has not had cycle since February)  Plan:   - Discussion had that since antibiotic therapy has failed to cause improvement/resolution of suspected right tubo-ovarian abscess, recommendation at this time is for surgical intervention with removal of the mass.  Patient notes understanding and is ok with plan.  - Further discussion had with patient regarding management fibroid uterus and heavy menses causing chronic anemia.  Patient had been relatively asymptomatic, and was unaware of her fibroids or anemia until recent admission.  Discussed management options, including hormonal therapy (Mirena IUD, progesterone OCPs, Depot Lupron, endometrial ablation, or hysterectomy.  Patient desires hysterectomy.  The risks of surgery were discussed in detail with the patient including but not limited to: bleeding which may require transfusion or reoperation; infection which may require prolonged hospitalization or re-hospitalization and antibiotic therapy; injury to bowel, bladder, ureters and major vessels or other surrounding organs; need for additional procedures including laparotomy; thromboembolic phenomenon, incisional problems and other postoperative or anesthesia complications.  Patient was told that the likelihood that her condition and symptoms will be treated effectively with this surgical management was very high; the postoperative expectations were also discussed in detail.  All questions were answered.  She was told that she will be contacted by our surgical scheduler regarding the time and date of her surgery; routine preoperative instructions of having nothing to eat or drink after midnight on the day prior to surgery and also coming to the hospital 1.5 hours prior to her time of surgery were also emphasized.  She was told she may be called for a preoperative appointment about a week prior to surgery and will be given further preoperative instructions at that visit. Printed patient education handouts about the procedure were given to the patient to review at home.  Discussion had on preservation vs removal of normal ovary, including risks and benefits.  Patient desires to keep normal ovary. Patient scheduled for 07/17/2015 for TAH, RSO, left salpingectomy.   - Discussed blood products.  Patient aware of potential need for blood products either prior to, during, or after surgical procedure.     A total of 30 minutes were spent face-to-face with the patient during this encounter and over half of that time involved counseling and coordination of care.  Rubie Maid, MD [facility]

## 2015-07-25 ENCOUNTER — Ambulatory Visit: Payer: Self-pay | Admitting: Obstetrics and Gynecology

## 2015-07-25 ENCOUNTER — Other Ambulatory Visit: Payer: Self-pay

## 2015-07-25 ENCOUNTER — Encounter: Payer: Self-pay | Admitting: Obstetrics and Gynecology

## 2015-07-27 ENCOUNTER — Ambulatory Visit (INDEPENDENT_AMBULATORY_CARE_PROVIDER_SITE_OTHER): Payer: Self-pay | Admitting: Obstetrics and Gynecology

## 2015-07-27 ENCOUNTER — Encounter
Admission: RE | Admit: 2015-07-27 | Discharge: 2015-07-27 | Disposition: A | Discharge status: Home / Self Care | Payer: Self-pay | Source: Ambulatory Visit | Attending: Obstetrics and Gynecology | Admitting: Obstetrics and Gynecology

## 2015-07-27 ENCOUNTER — Other Ambulatory Visit: Payer: Self-pay | Admitting: Obstetrics and Gynecology

## 2015-07-27 ENCOUNTER — Encounter: Payer: Self-pay | Admitting: Obstetrics and Gynecology

## 2015-07-27 VITALS — BP 112/66 | HR 62 | Ht 60.0 in | Wt 133.9 lb

## 2015-07-27 DIAGNOSIS — D259 Leiomyoma of uterus, unspecified: Secondary | ICD-10-CM

## 2015-07-27 DIAGNOSIS — N949 Unspecified condition associated with female genital organs and menstrual cycle: Secondary | ICD-10-CM

## 2015-07-27 DIAGNOSIS — R300 Dysuria: Secondary | ICD-10-CM

## 2015-07-27 DIAGNOSIS — Z124 Encounter for screening for malignant neoplasm of cervix: Secondary | ICD-10-CM

## 2015-07-27 DIAGNOSIS — Z01812 Encounter for preprocedural laboratory examination: Secondary | ICD-10-CM | POA: Insufficient documentation

## 2015-07-27 DIAGNOSIS — N9489 Other specified conditions associated with female genital organs and menstrual cycle: Secondary | ICD-10-CM

## 2015-07-27 DIAGNOSIS — R102 Pelvic and perineal pain unspecified side: Secondary | ICD-10-CM

## 2015-07-27 HISTORY — DX: Gastro-esophageal reflux disease without esophagitis: K21.9

## 2015-07-27 LAB — CBC
HCT: 28.1 % — ABNORMAL LOW (ref 35.0–47.0)
HEMOGLOBIN: 8.6 g/dL — AB (ref 12.0–16.0)
MCH: 19.7 pg — AB (ref 26.0–34.0)
MCHC: 30.7 g/dL — AB (ref 32.0–36.0)
MCV: 64 fL — ABNORMAL LOW (ref 80.0–100.0)
Platelets: 180 10*3/uL (ref 150–440)
RBC: 4.39 MIL/uL (ref 3.80–5.20)
RDW: 23.8 % — AB (ref 11.5–14.5)
WBC: 3.9 10*3/uL (ref 3.6–11.0)

## 2015-07-27 LAB — COMPREHENSIVE METABOLIC PANEL
ALK PHOS: 71 U/L (ref 38–126)
ALT: 17 U/L (ref 14–54)
ANION GAP: 8 (ref 5–15)
AST: 26 U/L (ref 15–41)
Albumin: 3.9 g/dL (ref 3.5–5.0)
BILIRUBIN TOTAL: 0.7 mg/dL (ref 0.3–1.2)
BUN: 8 mg/dL (ref 6–20)
CALCIUM: 8.9 mg/dL (ref 8.9–10.3)
CO2: 25 mmol/L (ref 22–32)
Chloride: 106 mmol/L (ref 101–111)
Creatinine, Ser: 0.66 mg/dL (ref 0.44–1.00)
GFR calc non Af Amer: >60 mL/min (ref >60)
Glucose, Bld: 94 mg/dL (ref 65–99)
POTASSIUM: 4 mmol/L (ref 3.5–5.1)
SODIUM: 139 mmol/L (ref 135–145)
TOTAL PROTEIN: 7.4 g/dL (ref 6.5–8.1)

## 2015-07-27 LAB — POCT URINALYSIS DIPSTICK
BILIRUBIN UA: NEGATIVE
Blood, UA: NEGATIVE
GLUCOSE UA: NEGATIVE
Ketones, UA: NEGATIVE
LEUKOCYTES UA: NEGATIVE
NITRITE UA: NEGATIVE
PH UA: 5
Protein, UA: NEGATIVE
Spec Grav, UA: 1.01
Urobilinogen, UA: NEGATIVE

## 2015-07-27 NOTE — Pre-Procedure Instructions (Signed)
NOTIFIED DR Amie Critchley OF PATIENT HEMOGLOBIN 8.6. NO NEW ORDERS. FAXED RESULTS TO DR. CHERRY.

## 2015-07-28 LAB — CYTOLOGY - PAP

## 2015-07-28 LAB — HEPATITIS C ANTIBODY: HCV Ab: 0.1 s/co ratio (ref 0.0–0.9)

## 2015-07-28 LAB — HEPATITIS B SURFACE ANTIGEN: HEP B S AG: NEGATIVE

## 2015-07-28 LAB — ABO/RH: ABO/RH(D): O POS

## 2015-07-30 NOTE — Progress Notes (Signed)
    GYNECOLOGY PROGRESS NOTE  Subjective:    Patient ID: Sherry Ward, female    DOB: 1965/05/24, 50 y.o.   MRN: [id number]  HPI  Patient is a 50 y.o. G65P3002 female who presents for follow up. Pre-op done last visit for scheduled TAH/RSO, however patient missed scheduled surgery date due to financial reasons.  Now has deposit and is ready to proceed with surgery.  Surgery now scheduled for 07/31/2015.   The following portions of the patient's history were reviewed and updated as appropriate: allergies, current medications, past family history, past medical history, past social history, past surgical history and problem list.  Review of Systems A comprehensive review of systems was negative except for: Genitourinary: positive for pelvic pain and dysuria   Objective:   Blood pressure 112/66, pulse 62, height 5' (1.524 m), weight 133 lb 14.4 oz (60.737 kg), last menstrual period 07/26/2015. General appearance: alert, no distress Abdomen: normal findings: bowel sounds normal and soft and abnormal findings:  mass, located in the lower abdomen and mild tenderness in the RLQ Pelvic: cervix normal in appearance, external genitalia normal, no cervical motion tenderness, positive findings: adnexal mass left sided, mobile or uterine enlargement, rectovaginal septum normal and vagina normal without discharge Extremities: extremities normal, atraumatic, no cyanosis or edema Neurologic: Grossly normal    Labs:  Results for orders placed or performed in visit on 07/27/15  POCT urinalysis dipstick  Result Value Ref Range   Color, UA pale yellow    Clarity, UA clear    Glucose, UA neg    Bilirubin, UA neg    Ketones, UA neg    Spec Grav, UA 1.010    Blood, UA neg    pH, UA 5.0    Protein, UA neg    Urobilinogen, UA negative    Nitrite, UA neg    Leukocytes, UA Negative Negative     Assessment:   Cervical cancer screening Fibroid uterus Right adnexal mass Pelvic  pain Dysuria  Plan:   Patient scheduled for pre-admission testing today after today's visit.  All questions answered again regarding upcoming surgery.  Pap smear performed today.  Patient with no recent h/o pap smears.  UA performed, no evidence of UTI.   Scheduled for surgery TAH/RSO 07/31/2015.    Rubie Maid, MD [facility]

## 2015-07-31 ENCOUNTER — Encounter
Admission: RE | Disposition: A | Discharge status: Home / Self Care | Payer: Self-pay | Source: Ambulatory Visit | Attending: Obstetrics and Gynecology

## 2015-07-31 ENCOUNTER — Inpatient Hospital Stay
Admission: RE | Admit: 2015-07-31 | Discharge: 2015-08-03 | DRG: 743 | Disposition: A | Discharge status: Home / Self Care | Payer: Self-pay | Source: Ambulatory Visit | Attending: Obstetrics and Gynecology | Admitting: Obstetrics and Gynecology

## 2015-07-31 ENCOUNTER — Inpatient Hospital Stay: Payer: Self-pay | Admitting: Certified Registered"

## 2015-07-31 ENCOUNTER — Encounter: Payer: Self-pay | Admitting: *Deleted

## 2015-07-31 DIAGNOSIS — R102 Pelvic and perineal pain: Secondary | ICD-10-CM

## 2015-07-31 DIAGNOSIS — Z90710 Acquired absence of both cervix and uterus: Secondary | ICD-10-CM | POA: Diagnosis present

## 2015-07-31 DIAGNOSIS — Z86018 Personal history of other benign neoplasm: Secondary | ICD-10-CM | POA: Diagnosis present

## 2015-07-31 DIAGNOSIS — N920 Excessive and frequent menstruation with regular cycle: Secondary | ICD-10-CM | POA: Diagnosis present

## 2015-07-31 DIAGNOSIS — N949 Unspecified condition associated with female genital organs and menstrual cycle: Secondary | ICD-10-CM

## 2015-07-31 DIAGNOSIS — D509 Iron deficiency anemia, unspecified: Secondary | ICD-10-CM | POA: Diagnosis present

## 2015-07-31 DIAGNOSIS — N839 Noninflammatory disorder of ovary, fallopian tube and broad ligament, unspecified: Secondary | ICD-10-CM | POA: Diagnosis present

## 2015-07-31 DIAGNOSIS — D500 Iron deficiency anemia secondary to blood loss (chronic): Secondary | ICD-10-CM | POA: Diagnosis present

## 2015-07-31 DIAGNOSIS — D259 Leiomyoma of uterus, unspecified: Principal | ICD-10-CM | POA: Diagnosis present

## 2015-07-31 DIAGNOSIS — N736 Female pelvic peritoneal adhesions (postinfective): Secondary | ICD-10-CM | POA: Diagnosis present

## 2015-07-31 DIAGNOSIS — N7093 Salpingitis and oophoritis, unspecified: Secondary | ICD-10-CM | POA: Diagnosis present

## 2015-07-31 DIAGNOSIS — R300 Dysuria: Secondary | ICD-10-CM

## 2015-07-31 LAB — BASIC METABOLIC PANEL
ANION GAP: 7 (ref 5–15)
BUN: 15 mg/dL (ref 6–20)
CHLORIDE: 109 mmol/L (ref 101–111)
CO2: 23 mmol/L (ref 22–32)
Calcium: 8.5 mg/dL — ABNORMAL LOW (ref 8.9–10.3)
Creatinine, Ser: 0.64 mg/dL (ref 0.44–1.00)
GFR calc Af Amer: >60 mL/min (ref >60)
GFR calc non Af Amer: >60 mL/min (ref >60)
GLUCOSE: 137 mg/dL — AB (ref 65–99)
POTASSIUM: 3.7 mmol/L (ref 3.5–5.1)
Sodium: 139 mmol/L (ref 135–145)

## 2015-07-31 LAB — POCT PREGNANCY, URINE: PREG TEST UR: NEGATIVE

## 2015-07-31 SURGERY — HYSTERECTOMY, TOTAL, ABDOMINAL, WITH SALPINGECTOMY
Anesthesia: General

## 2015-07-31 MED ORDER — LIDOCAINE 5 % EX PTCH
MEDICATED_PATCH | CUTANEOUS | Status: DC | PRN
  Administered 2015-07-31: 1 via TRANSDERMAL

## 2015-07-31 MED ORDER — SENNOSIDES-DOCUSATE SODIUM 8.6-50 MG PO TABS: 1.0000 | ORAL_TABLET | Freq: Every evening | ORAL | Status: DC | PRN

## 2015-07-31 MED ORDER — DOCUSATE SODIUM 100 MG PO CAPS
100.0000 mg | ORAL_CAPSULE | Freq: Two times a day (BID) | ORAL | Status: DC
  Administered 2015-07-31 – 2015-08-03 (×6): 100 mg via ORAL
  Filled 2015-07-31 (×6): qty 1

## 2015-07-31 MED ORDER — GLYCOPYRROLATE 0.2 MG/ML IJ SOLN
INTRAMUSCULAR | Status: DC | PRN
  Administered 2015-07-31: .4 mg via INTRAVENOUS

## 2015-07-31 MED ORDER — LACTATED RINGERS IV SOLN: INTRAVENOUS | Status: DC

## 2015-07-31 MED ORDER — LIDOCAINE HCL (CARDIAC) 20 MG/ML IV SOLN
INTRAVENOUS | Status: DC | PRN
  Administered 2015-07-31: 60 mg via INTRAVENOUS

## 2015-07-31 MED ORDER — SODIUM CHLORIDE FLUSH 0.9 % IV SOLN
INTRAVENOUS | Status: AC
Start: 2015-07-31 — End: 2015-07-31
  Filled 2015-07-31: qty 3

## 2015-07-31 MED ORDER — FAMOTIDINE 20 MG PO TABS
ORAL_TABLET | ORAL | Status: AC
  Filled 2015-07-31: qty 1

## 2015-07-31 MED ORDER — BISACODYL 10 MG RE SUPP: 10.0000 mg | Freq: Every day | RECTAL | Status: DC | PRN

## 2015-07-31 MED ORDER — LACTATED RINGERS IV SOLN
INTRAVENOUS | Status: DC
  Administered 2015-07-31: 07:00:00 via INTRAVENOUS

## 2015-07-31 MED ORDER — CEFAZOLIN SODIUM-DEXTROSE 2-4 GM/100ML-% IV SOLN
INTRAVENOUS | Status: AC
  Filled 2015-07-31: qty 100

## 2015-07-31 MED ORDER — FENTANYL CITRATE (PF) 100 MCG/2ML IJ SOLN
INTRAMUSCULAR | Status: AC
  Administered 2015-07-31: 25 ug via INTRAVENOUS
  Filled 2015-07-31: qty 2

## 2015-07-31 MED ORDER — DEXAMETHASONE SODIUM PHOSPHATE 10 MG/ML IJ SOLN
INTRAMUSCULAR | Status: DC | PRN
  Administered 2015-07-31: 4 mg via INTRAVENOUS

## 2015-07-31 MED ORDER — PANTOPRAZOLE SODIUM 40 MG PO TBEC
40.0000 mg | DELAYED_RELEASE_TABLET | Freq: Every day | ORAL | Status: DC
  Administered 2015-08-01 – 2015-08-03 (×3): 40 mg via ORAL
  Filled 2015-07-31 (×3): qty 1

## 2015-07-31 MED ORDER — HYDROMORPHONE HCL 1 MG/ML IJ SOLN
0.2000 mg | INTRAMUSCULAR | Status: DC | PRN
  Administered 2015-07-31: 0.6 mg via INTRAVENOUS
  Administered 2015-07-31: 0.3 mg via INTRAVENOUS
  Administered 2015-07-31 – 2015-08-03 (×5): 0.6 mg via INTRAVENOUS
  Filled 2015-07-31 (×7): qty 1

## 2015-07-31 MED ORDER — SODIUM CHLORIDE 0.9 % IV SOLN: Freq: Once | INTRAVENOUS | Status: DC

## 2015-07-31 MED ORDER — SIMETHICONE 80 MG PO CHEW: 80.0000 mg | CHEWABLE_TABLET | Freq: Four times a day (QID) | ORAL | Status: DC | PRN

## 2015-07-31 MED ORDER — KETOROLAC TROMETHAMINE 30 MG/ML IJ SOLN
INTRAMUSCULAR | Status: AC
  Administered 2015-07-31: 30 mg via INTRAVENOUS
  Filled 2015-07-31: qty 1

## 2015-07-31 MED ORDER — OXYCODONE-ACETAMINOPHEN 5-325 MG PO TABS
1.0000 | ORAL_TABLET | ORAL | Status: DC | PRN
  Administered 2015-08-01: 2 via ORAL
  Administered 2015-08-02 – 2015-08-03 (×3): 1 via ORAL
  Filled 2015-07-31: qty 2
  Filled 2015-07-31 (×3): qty 1

## 2015-07-31 MED ORDER — MENTHOL 3 MG MT LOZG
1.0000 | LOZENGE | OROMUCOSAL | Status: DC | PRN
  Filled 2015-07-31: qty 9

## 2015-07-31 MED ORDER — MAGNESIUM CITRATE PO SOLN
1.0000 | Freq: Once | ORAL | Status: DC | PRN
  Filled 2015-07-31: qty 296

## 2015-07-31 MED ORDER — KETOROLAC TROMETHAMINE 30 MG/ML IJ SOLN
30.0000 mg | Freq: Once | INTRAMUSCULAR | Status: AC
  Administered 2015-07-31: 30 mg via INTRAVENOUS

## 2015-07-31 MED ORDER — ACETAMINOPHEN 10 MG/ML IV SOLN
INTRAVENOUS | Status: AC
  Filled 2015-07-31: qty 100

## 2015-07-31 MED ORDER — PROPOFOL 10 MG/ML IV BOLUS
INTRAVENOUS | Status: DC | PRN
  Administered 2015-07-31: 120 mg via INTRAVENOUS

## 2015-07-31 MED ORDER — LACTATED RINGERS IV SOLN
INTRAVENOUS | Status: DC
  Administered 2015-07-31 – 2015-08-03 (×7): via INTRAVENOUS

## 2015-07-31 MED ORDER — ONDANSETRON HCL 4 MG PO TABS: 4.0000 mg | ORAL_TABLET | Freq: Four times a day (QID) | ORAL | Status: DC | PRN

## 2015-07-31 MED ORDER — HEPARIN SODIUM (PORCINE) 10000 UNIT/ML IJ SOLN
INTRAMUSCULAR | Status: AC
  Filled 2015-07-31: qty 3

## 2015-07-31 MED ORDER — FENTANYL CITRATE (PF) 100 MCG/2ML IJ SOLN
25.0000 ug | INTRAMUSCULAR | Status: AC | PRN
  Administered 2015-07-31 (×6): 25 ug via INTRAVENOUS

## 2015-07-31 MED ORDER — PHENYLEPHRINE HCL 10 MG/ML IJ SOLN
INTRAMUSCULAR | Status: DC | PRN
  Administered 2015-07-31: 100 ug via INTRAVENOUS
  Administered 2015-07-31: 50 ug via INTRAVENOUS
  Administered 2015-07-31: 100 ug via INTRAVENOUS
  Administered 2015-07-31: 50 ug via INTRAVENOUS
  Administered 2015-07-31: 100 ug via INTRAVENOUS

## 2015-07-31 MED ORDER — ONDANSETRON HCL 4 MG/2ML IJ SOLN
4.0000 mg | Freq: Once | INTRAMUSCULAR | Status: DC | PRN
Start: 2015-07-31 — End: 2015-08-03

## 2015-07-31 MED ORDER — FENTANYL CITRATE (PF) 100 MCG/2ML IJ SOLN
INTRAMUSCULAR | Status: DC | PRN
  Administered 2015-07-31 (×2): 25 ug via INTRAVENOUS
  Administered 2015-07-31: 100 ug via INTRAVENOUS
  Administered 2015-07-31: 25 ug via INTRAVENOUS

## 2015-07-31 MED ORDER — ACETAMINOPHEN 10 MG/ML IV SOLN
INTRAVENOUS | Status: DC | PRN
  Administered 2015-07-31: 1000 mg via INTRAVENOUS

## 2015-07-31 MED ORDER — CEFAZOLIN SODIUM-DEXTROSE 2-4 GM/100ML-% IV SOLN
2.0000 g | INTRAVENOUS | Status: AC
  Administered 2015-07-31: 2 g via INTRAVENOUS

## 2015-07-31 MED ORDER — NEOSTIGMINE METHYLSULFATE 10 MG/10ML IV SOLN
INTRAVENOUS | Status: DC | PRN
  Administered 2015-07-31: 3 mg via INTRAVENOUS

## 2015-07-31 MED ORDER — LIDOCAINE 5 % EX PTCH
MEDICATED_PATCH | CUTANEOUS | Status: AC
  Filled 2015-07-31: qty 1

## 2015-07-31 MED ORDER — ALUM & MAG HYDROXIDE-SIMETH 200-200-20 MG/5ML PO SUSP
30.0000 mL | ORAL | Status: DC | PRN
  Filled 2015-07-31: qty 30

## 2015-07-31 MED ORDER — ROCURONIUM BROMIDE 100 MG/10ML IV SOLN
INTRAVENOUS | Status: DC | PRN
  Administered 2015-07-31: 5 mg via INTRAVENOUS
  Administered 2015-07-31: 50 mg via INTRAVENOUS

## 2015-07-31 MED ORDER — IBUPROFEN 600 MG PO TABS
600.0000 mg | ORAL_TABLET | Freq: Four times a day (QID) | ORAL | Status: DC | PRN
  Administered 2015-08-01 – 2015-08-03 (×6): 600 mg via ORAL
  Filled 2015-07-31 (×6): qty 1

## 2015-07-31 MED ORDER — ONDANSETRON HCL 4 MG/2ML IJ SOLN
4.0000 mg | Freq: Four times a day (QID) | INTRAMUSCULAR | Status: DC | PRN
  Administered 2015-07-31: 4 mg via INTRAVENOUS
  Filled 2015-07-31: qty 2

## 2015-07-31 MED ORDER — FAMOTIDINE 20 MG PO TABS
20.0000 mg | ORAL_TABLET | Freq: Once | ORAL | Status: AC
  Administered 2015-07-31: 20 mg via ORAL

## 2015-07-31 MED ORDER — MIDAZOLAM HCL 2 MG/2ML IJ SOLN
INTRAMUSCULAR | Status: DC | PRN
  Administered 2015-07-31: 2 mg via INTRAVENOUS

## 2015-07-31 MED ORDER — ONDANSETRON HCL 4 MG/2ML IJ SOLN
INTRAMUSCULAR | Status: DC | PRN
  Administered 2015-07-31: 4 mg via INTRAVENOUS

## 2015-07-31 MED ORDER — LIDOCAINE 5 % EX PTCH
1.0000 | MEDICATED_PATCH | CUTANEOUS | Status: AC
  Administered 2015-08-01 – 2015-08-02 (×2): 1 via TRANSDERMAL
  Filled 2015-07-31 (×2): qty 1

## 2015-07-31 MED ORDER — ZOLPIDEM TARTRATE 5 MG PO TABS: 5.0000 mg | ORAL_TABLET | Freq: Every evening | ORAL | Status: DC | PRN

## 2015-07-31 MED ORDER — FENTANYL CITRATE (PF) 100 MCG/2ML IJ SOLN: 25.0000 ug | INTRAMUSCULAR | Status: DC | PRN

## 2015-07-31 MED ORDER — ONDANSETRON HCL 4 MG/2ML IJ SOLN: 4.0000 mg | Freq: Once | INTRAMUSCULAR | Status: DC | PRN

## 2015-07-31 SURGICAL SUPPLY — 40 items
BAG COUNTER SPONGE EZ (MISCELLANEOUS) IMPLANT
CANISTER SUCT 1200ML W/VALVE (MISCELLANEOUS) ×3 IMPLANT
CATH TRAY 16F METER LATEX (MISCELLANEOUS) ×3 IMPLANT
CELL SAVER ADDITIONAL TIME PER (MISCELLANEOUS) ×3
CELL SAVER STANDBY W/COLL (MISCELLANEOUS) ×3
CHLORAPREP W/TINT 26ML (MISCELLANEOUS) ×3 IMPLANT
COUNTER SPONGE BAG EZ (MISCELLANEOUS)
DRAPE LAPAROTOMY 100X77 ABD (DRAPES) ×3 IMPLANT
DRAPE LAPAROTOMY TRNSV 106X77 (MISCELLANEOUS) IMPLANT
DRSG OPSITE POSTOP 4X8 (GAUZE/BANDAGES/DRESSINGS) ×3 IMPLANT
DRSG TELFA 3X8 NADH (GAUZE/BANDAGES/DRESSINGS) IMPLANT
ELECT BLADE 6 FLAT ULTRCLN (ELECTRODE) ×3 IMPLANT
ELECT CAUTERY BLADE 6.4 (BLADE) ×3 IMPLANT
ELECT REM PT RETURN 9FT ADLT (ELECTROSURGICAL) ×3
ELECTRODE REM PT RTRN 9FT ADLT (ELECTROSURGICAL) ×1 IMPLANT
GAUZE SPONGE 4X4 12PLY STRL (GAUZE/BANDAGES/DRESSINGS) IMPLANT
GLOVE BIO SURGEON STRL SZ 6 (GLOVE) ×3 IMPLANT
GLOVE BIOGEL PI IND STRL 6.5 (GLOVE) ×8 IMPLANT
GLOVE BIOGEL PI INDICATOR 6.5 (GLOVE) ×16
GOWN STRL REUS W/ TWL LRG LVL3 (GOWN DISPOSABLE) ×2 IMPLANT
GOWN STRL REUS W/TWL LRG LVL3 (GOWN DISPOSABLE) ×4
KIT RM TURNOVER CYSTO AR (KITS) ×3 IMPLANT
LABEL OR SOLS (LABEL) IMPLANT
LIGASURE IMPACT 36 18CM CVD LR (INSTRUMENTS) ×3 IMPLANT
NS IRRIG 1000ML POUR BTL (IV SOLUTION) ×3 IMPLANT
PACK BASIN MAJOR ARMC (MISCELLANEOUS) ×3 IMPLANT
RETRACTOR WOUND ALXS 18CM MED (MISCELLANEOUS) ×1 IMPLANT
RTRCTR WOUND ALEXIS O 18CM MED (MISCELLANEOUS) ×3
SPONGE LAP 18X18 5 PK (GAUZE/BANDAGES/DRESSINGS) IMPLANT
STANDBY W/COLL CELL SAVER (MISCELLANEOUS) ×1 IMPLANT
STAPLER SKIN PROX 35W (STAPLE) ×3 IMPLANT
SUT CHROMIC 0 CT 1 (SUTURE) IMPLANT
SUT MAXON ABS #0 GS21 30IN (SUTURE) IMPLANT
SUT VIC AB 0 CT1 27 (SUTURE) ×2
SUT VIC AB 0 CT1 27XCR 8 STRN (SUTURE) ×1 IMPLANT
SUT VIC AB 0 CT1 36 (SUTURE) ×3 IMPLANT
SUT VICRYL+ 3-0 36IN CT-1 (SUTURE) ×3 IMPLANT
SYR BULB IRRIG 60ML STRL (SYRINGE) IMPLANT
TIME ADDITIONAL PER CELL SAVER (MISCELLANEOUS) ×1 IMPLANT
TRAY PREP VAG/GEN (MISCELLANEOUS) ×3 IMPLANT

## 2015-07-31 NOTE — Transfer of Care (Signed)
Immediate Anesthesia Transfer of Care Note  Patient: Sherry Ward  Procedure(s) Performed: Procedure(s): HYSTERECTOMY ABDOMINAL WITH RIGHT SALPINGO OOPHERECTOMY, LEFT SALPINGECTOMY (N/A)  Patient Location: PACU  Anesthesia Type:General  Level of Consciousness: responds to stimulation, sleeping  Airway & Oxygen Therapy: Patient Spontanous Breathing and Patient connected to face mask oxygen  Post-op Assessment: Report given to RN and Post -op Vital signs reviewed and stable  Post vital signs: Reviewed and stable  Last Vitals:  Filed Vitals:   07/31/15 0607 07/31/15 0929  BP: 124/71 121/61  Pulse: 60 51  Temp: 36.3 C   Resp: 16 17    Last Pain:  Filed Vitals:   07/31/15 0929  PainSc: 2          Complications: No apparent anesthesia complications

## 2015-07-31 NOTE — Anesthesia Procedure Notes (Signed)
Procedure Name: Intubation Performed by: Lance Muss Pre-anesthesia Checklist: Emergency Drugs available, Patient identified, Suction available, Patient being monitored and Timeout performed Patient Re-evaluated:Patient Re-evaluated prior to inductionOxygen Delivery Method: Circle system utilized Preoxygenation: Pre-oxygenation with 100% oxygen Intubation Type: IV induction Ventilation: Mask ventilation without difficulty Laryngoscope Size: Mac and 3 Grade View: Grade I Tube type: Oral Tube size: 7.0 mm Number of attempts: 1 Airway Equipment and Method: Stylet Placement Confirmation: ETT inserted through vocal cords under direct vision,  positive ETCO2 and breath sounds checked- equal and bilateral Secured at: 19 cm Tube secured with: Tape Dental Injury: Teeth and Oropharynx as per pre-operative assessment

## 2015-07-31 NOTE — Op Note (Addendum)
Hysterectomy Procedure Note  Indications:  50 y.o. [id number] female with pelvic pain (mostly right-sided), severe anemia, fibroid uterus, persistent right adnexal mass (tubo-ovarian abscess).  Pre-operative Diagnosis: pelvic pain (right-sided), right persistent tubo-ovarian abscess, fibroid uterus, menorrhagia, anemia  Post-operative Diagnosis: Same, except no right tubo-ovarian abscess, pelvic adhesive disease present  Operation: Total abdominal hysterectomy, right salpingo-oophorectomy, left salpingectomy, lysis of adhesions, escharotomy  Surgeon: Rubie Maid, MD   Assistants: Malachi Paradise, MD  Anesthesia: General endotracheal anesthesia  ASA Class: 2  Procedure Details  The patient was seen in the Holding Room. The risks, benefits, complications, treatment options, and expected outcomes were discussed with the patient.  The patient concurred with the proposed plan, giving informed consent.  The site of surgery properly noted/marked. The patient was taken to Operating Room # 5, identified as Grant Mazzola and the procedure verified as Total abdominal hysterectomy, right salpingo-oophorectomy, left salpingectomy. A Time Out was held and the above information confirmed.  After induction of anesthesia, the patient was draped and prepped in the usual sterile manner. Pt was placed in supine position after anesthesia and draped and prepped in the usual sterile manner. Foley catheter was placed.  An infraumbilical midline incision was made and carried through the subcutaneous tissue to the fascia. Fascial incision was made and extended vertically. The rectus muscles were dissected off the fascia. The peritoneum was identified and entered. Peritoneal incision was extended longitudinally.  The above findings were noted. The Alexis retractor was placed and bowel was packed away from the surgical site.  Filmy adhesions of the bowel to the posterior surface of the uterus were noted during  the packing process.  These were bluntly lysed.   The round ligaments were identified, cut, and ligated with 0-Vicryl. The anterior peritoneal reflection was incised and the bladder was dissected off the lower uterine segment. The retroperitoneal space was explored and the ureters were identified bilaterally. The right utero-ovarian ligament and proximal fallopian tube were grasped,cut and ligated using the Ligasure device. The left utero-ovarian ligament and proximal fallopian tube were grasped, cut and ligated with the Ligasure device. Hemostasis was observed. The uterine vessels were skeletonized, then clamped, cut and ligated with Ligasure device. Serial pedicles of the cardinal and utero-sacral ligaments were clamped, cut, and ligated with Ligasure device. Entrance was made into the vagina and the uterus removed. Vaginal cuff angle sutures were placed incorporating the utero-sacral ligaments for support. The vaginal cuff was then closed with a figure-of-eight stitch of 0- Vicryl. Lavage was carried out until clear. Hemostasis was observed.  Attention was then turned again to the adnexa.  The left fallopian tube and ovary were noted to be adherent to the small bowel and the omentum.  These adhesions were lysed using the bovie.  Once freed, the left  Fallopian tube was clamped, cut and ligated using the Ligasure device. The right adnexa were noted to have filmy adhesions to the pelvic sidewall, these were lysed using the bovie.  Once freed from all adhesions, the right infundibulopelvic ligament was then clamped, cut and ligated using the Ligasure device. Hemostasis was observed.  Attempts were made to identify the ureters bilaterally in the pelvis.  The ureters could be palpated, however unable to be visualized due to redundant peritoneum.   Retractor and all packing was removed from the abdomen. The fascia was approximated with a running suture of 0-Vicryl. Lavage was again carried out. Hemostasis was  observed.  The previous incision scar was excised using the scalpel.  The skin was approximated with staples.  Instrument, sponge, and needle counts were correct prior to abdominal closure and at the conclusion of the case.   Findings: Enlarged fibroid uterus Normal appearing ovaries bilaterally.  No evidence of tubo-ovarian abscess Fallopian tubes previously surgically interrupted.  Dilated distal fallopian tubes bilaterally. Pelvic adhesions of posterior surface of the uterus to bowel, bilateral adnexae with pelvic adhesions to bowel and sidewall.   Estimated Blood Loss:  200 mL.  Cell savers present for procedure, not enough blood return to use.         Drains: foley catheter to gravity, with 400 ml of clear urine at end of procedure.          Total IV Fluids: 800 ml         Specimens: Uterus with cervix, bilateral fallopian tubes, right ovary         Implants: None         Complications:  None; patient tolerated the procedure well.         Disposition: PACU - hemodynamically stable.         Condition: stable    Rubie Maid, MD [facility]

## 2015-07-31 NOTE — Anesthesia Preprocedure Evaluation (Signed)
Anesthesia Evaluation  Patient identified by MRN, date of birth, ID band Patient awake    Reviewed: Allergy & Precautions, NPO status , Patient's Chart, lab work & pertinent test results  Airway Mallampati: II       Dental  (+) Teeth Intact, Caps   Pulmonary neg pulmonary ROS,    breath sounds clear to auscultation       Cardiovascular Exercise Tolerance: Good  Rhythm:Regular     Neuro/Psych negative neurological ROS     GI/Hepatic Neg liver ROS, GERD  ,  Endo/Other  negative endocrine ROS  Renal/GU negative Renal ROS     Musculoskeletal negative musculoskeletal ROS (+)   Abdominal Normal abdominal exam  (+)   Peds negative pediatric ROS (+)  Hematology negative hematology ROS (+)   Anesthesia Other Findings   Reproductive/Obstetrics                             Anesthesia Physical Anesthesia Plan  ASA: I  Anesthesia Plan: General   Post-op Pain Management:    Induction: Intravenous  Airway Management Planned: Oral ETT  Additional Equipment:   Intra-op Plan:   Post-operative Plan: Extubation in OR  Informed Consent: I have reviewed the patients History and Physical, chart, labs and discussed the procedure including the risks, benefits and alternatives for the proposed anesthesia with the patient or authorized representative who has indicated his/her understanding and acceptance.     Plan Discussed with: CRNA  Anesthesia Plan Comments:         Anesthesia Quick Evaluation

## 2015-07-31 NOTE — Anesthesia Postprocedure Evaluation (Signed)
Anesthesia Post Note  Patient: Engineer, manufacturing systems  Procedure(s) Performed: Procedure(s) (LRB): HYSTERECTOMY ABDOMINAL WITH RIGHT SALPINGO OOPHERECTOMY, LEFT SALPINGECTOMY (N/A)  Patient location during evaluation: PACU Anesthesia Type: General Level of consciousness: awake Pain management: pain level controlled Vital Signs Assessment: post-procedure vital signs reviewed and stable Respiratory status: spontaneous breathing Cardiovascular status: blood pressure returned to baseline Anesthetic complications: no    Last Vitals:  Filed Vitals:   07/31/15 1308 07/31/15 1418  BP: 114/62 111/60  Pulse: 67 63  Temp: 36.9 C 36.8 C  Resp: 16 18    Last Pain:  Filed Vitals:   07/31/15 1419  PainSc: Asleep                 VAN STAVEREN,Randall Rampersad

## 2015-07-31 NOTE — H&P (Addendum)
GYNECOLOGY PRE-OPERATIVE HISTORY AND PHYSICAL  Subjective:    Patient is a 50 y.o. [id number] female scheduled for TAH/RSO/left salpingectomy. Indications for procedure are severe anemia, fibroid uterus, persistent right adnexal mass.   Pertinent Gynecological History: Menses: flow is excessive with use of 6-7 pads or tampons on heaviest days and regular every 28 days without intermenstrual spotting Contraception: none Last mammogram: patient cannot recall last mammogram  Last pap: normal Date: over 3 years ago.  Discussed Blood/Blood Products: yes  Menstrual History: Menarche age: 49  Patient's last menstrual period was 03/06/2015.     OB History  Gravida Para Term Preterm AB SAB TAB Ectopic Multiple Living  3 3 3       2     # Outcome Date GA Lbr Len/2nd Weight Sex Delivery Anes PTL Lv  3 Term  [redacted]w[redacted]d    CS-LTranv   Y  2 Term  [redacted]w[redacted]d    VBAC   Y  1 Term  [redacted]w[redacted]d    CS-LTranv  N FD      Past Medical History  Diagnosis Date  . Anemia   . Fibroid uterus 06/24/2015    Past Surgical History  Procedure Laterality Date  . Cesarean section      x2    History reviewed. No pertinent family history.   Social History   Social History  . Marital Status: Married    Spouse Name: N/A  . Number of Children: N/A  . Years of Education: N/A   Social History Main Topics  . Smoking status: Never Smoker   . Smokeless tobacco: None  . Alcohol Use: No  . Drug Use: No  . Sexual Activity: Yes    Birth Control/ Protection: None   Other Topics Concern  . None   Social History Narrative    Current Outpatient Prescriptions on File Prior to Visit  Medication Sig Dispense Refill  . docusate sodium (COLACE) 100 MG capsule Take 1 capsule (100 mg total) by mouth 2 (two) times daily as needed for mild constipation. 60  capsule 2  . ferrous sulfate 325 (65 FE) MG tablet Take 1 tablet (325 mg total) by mouth 3 (three) times daily with meals. 90 tablet 3  . ibuprofen (ADVIL,MOTRIN) 800 MG tablet Take 1 tablet (800 mg total) by mouth every 8 (eight) hours as needed. 60 tablet 1  . levofloxacin (LEVAQUIN) 500 MG tablet Take 1 tablet (500 mg total) by mouth daily. 7 tablet 0  . metroNIDAZOLE (FLAGYL) 500 MG tablet Take 1 tablet (500 mg total) by mouth 2 (two) times daily. 14 tablet 0  . ondansetron (ZOFRAN) 4 MG tablet Take 1 tablet (4 mg total) by mouth every 8 (eight) hours as needed for nausea or vomiting. 30 tablet 0   No current facility-administered medications on file prior to visit.    No Known Allergies  Review of Systems Constitutional: No recent fever/chills/sweats Respiratory: No recent cough/bronchitis Cardiovascular: No chest pain Gastrointestinal: No recent nausea/vomiting/diarrhea Genitourinary: No UTI symptoms Hematologic/lymphatic:No history of coagulopathy or recent blood thinner use   Objective:    BP 102/69 mmHg  Pulse 101  Ht 5' (1.524 m)  Wt 132 lb (59.875 kg)  BMI 25.78 kg/m2  LMP 03/06/2015  General:  Normal  Skin:  normal  HEENT: Normal  Neck: Supple without Adenopathy or Thyromegaly  Lungs:   Heart:    Breasts:   Abdomen:  Pelvis:  M/S   Extremeties:  Neuro:   clear to auscultation bilaterally  Normal  without murmur  Not Examined  soft,mildly tender on right and suprapubic; bowel sounds normal; mass present midway between pubic symphysis and umbilicus, no organomegaly  Exam deferred to OR  No CVAT  Warm/Dry   Normal           Labs:    CBC Latest Ref Rng 07/27/2015 06/27/2015 06/26/2015  WBC 3.6 - 11.0 K/uL 3.9 8.6 11.1(H)  Hemoglobin 12.0 - 16.0 g/dL 8.6(L) 6.8(L) 7.1(L)  Hematocrit 35.0 - 47.0 % 28.1(L) 22.1(L) 23.5(L)  Platelets 150 - 440 K/uL 180 360 344   Results  for Sherry, Ward (MRN [id number]) as of 07/31/2015 07:31  06/23/2015 13:08 06/23/2015 17:14 07/27/2015 13:32  Chlamydia Tr NOT DETECTED    N gonorrhoeae NOT DETECTED    RPR  Non Reactive   Hepatitis B Surface Ag   Negative  HCV Ab   0.1  HIV 1/2 Antibodies NON REACTIVE    Interpretation (HIV Ag Ab) A non reactive te...    HIV  Non Reactive   HIV-1 P24 Antigen - HIV24 NON REACTIVE     Pap smear 07/26/15: Negative.   Imaging:  US Transvaginal/Pelvis Complete Non-ob  06/23/2015 CLINICAL DATA: Tubo-ovarian abscess EXAM: TRANSABDOMINAL AND TRANSVAGINAL ULTRASOUND OF PELVIS TECHNIQUE: Both transabdominal and transvaginal ultrasound examinations of the pelvis were performed. Transabdominal technique was performed for global imaging of the pelvis including uterus, ovaries, adnexal regions, and pelvic cul-de-sac. It was necessary to proceed with endovaginal exam following the transabdominal exam to visualize the endometrium and ovaries. COMPARISON: None FINDINGS: Uterus Measurements: 10.7 x 7.1 x 7.1 cm. Multiple hypoechoic uterine masses with the largest measuring 6.5 x 6.7 x 3.4 cm near the fundus. Complex cystic mass in the cervix measuring 2.1 x 1 x 2.3 cm likely representing a nabothian cyst. Endometrium Thickness: 14.7 mm. No focal abnormality visualized. Right ovary Measurements: 4.8 x 2.1 x 2.5 cm. Normal appearance/no adnexal mass. Left ovary Complex left adnexal mass measuring 7.6 x 3.2 x 3.8 cm with complex fluid in the left adnexal region concerning for a tubo-ovarian abscess. Other findings No abnormal free fluid. IMPRESSION: 1. Complex left adnexal mass measuring 7.6 x 3.2 x 3.8 cm with complex fluid in the left adnexal region concerning for a tubo-ovarian abscess. 2. Fibroid uterus. Electronically Signed By: Kathreen Devoid On: 06/23/2015 14:52     US Transvaginal/Pelvis Complete Non-ob Date of Service: 07/05/15 Indications: Follow Up of probable tubo-ovarian abscess from  06/26/15 Findings:  The uterus measures 11.0 x 5.9 x 7.0 cm. Echo texture is diffusely heterogenous with evidence of focal masses. Within the uterus are multiple suspected fibroids measuring: Fibroid 1: 4.1 x 4.8 x 4.0 cm. Left fundal, intramural. Fibroid 2: 3.4 x 4.8 x 4.5 cm. Right anterior fundal, partially exophytic.  The Endometrium is ill defined, slightly echogenic and measures 9.9 mm.  Right Ovary measures 4.2 x 3.0 x 2.9 cm. There is a simple appearing cyst measuring 2.6 cm at its greatest dimension. Left adnexa area is surveyed and reveals a complex solid as well as cystic mass measuring 7.0 x 4.1 x 4.4 cm with internal vascular flow noted. This is unchanged to just slightly larger than prior ultrasound of 06/26/15. The left ovary is not definitely visualized.  There does not appear to be any free fluid in the cul de sac.  Impression: 1. Fibroid uterus. 2. Mass in the left adnexa again is seen. Stable, to just slightly increased to imaging of 06/26/15.   Assessment:   Persistent Right ovarian mass (tubo-ovarian abscess)  Fibroid uterus Anemia (due to chronic blood loss) Heavy menses (however has not had cycle since December)  Plan:   - Discussion had that since antibiotic therapy has failed to cause improvement/resolution of suspected right tubo-ovarian abscess, recommendation at this time is for surgical intervention with removal of the mass. Patient notes understanding and is ok with plan.  - Further discussion had with patient regarding management fibroid uterus and heavy menses causing chronic anemia. Patient had been relatively asymptomatic, and was unaware of her fibroids or anemia until recent admission. Discussed management options, including hormonal therapy (Mirena IUD, progesterone OCPs, Depot Lupron, endometrial ablation, or hysterectomy. Patient desires hysterectomy. The risks of surgery were discussed in detail with the patient including but not limited  to: bleeding which may require transfusion or reoperation; infection which may require prolonged hospitalization or re-hospitalization and antibiotic therapy; injury to bowel, bladder, ureters and major vessels or other surrounding organs; need for additional procedures including laparotomy; thromboembolic phenomenon, incisional problems and other postoperative or anesthesia complications. Patient was told that the likelihood that her condition and symptoms will be treated effectively with this surgical management was very high; the postoperative expectations were also discussed in detail. All questions were answered. She was told that she will be contacted by our surgical scheduler regarding the time and date of her surgery; routine preoperative instructions of having nothing to eat or drink after midnight on the day prior to surgery and also coming to the hospital 1.5 hours prior to her time of surgery were also emphasized. She was told she may be called for a preoperative appointment about a week prior to surgery and will be given further preoperative instructions at that visit. Printed patient education handouts about the procedure were given to the patient to review at home. Discussion had on preservation vs removal of normal ovary, including risks and benefits. Patient desires to keep normal ovary. Patient scheduled for 07/17/2015 for TAH, RSO, left salpingectomy.  - Discussion had on potential need for blood transfusion prior to, during, or after surgical procedure.Will attempt to use cell savers. Patient notes understanding. Will be consented for blood products.    Rubie Maid, MD [facility]

## 2015-08-01 LAB — CBC
HEMATOCRIT: 25.5 % — AB (ref 35.0–47.0)
Hemoglobin: 7.9 g/dL — ABNORMAL LOW (ref 12.0–16.0)
MCH: 19.8 pg — ABNORMAL LOW (ref 26.0–34.0)
MCHC: 30.9 g/dL — ABNORMAL LOW (ref 32.0–36.0)
MCV: 64 fL — ABNORMAL LOW (ref 80.0–100.0)
PLATELETS: 166 10*3/uL (ref 150–440)
RBC: 3.98 MIL/uL (ref 3.80–5.20)
RDW: 22.9 % — AB (ref 11.5–14.5)
WBC: 7.5 10*3/uL (ref 3.6–11.0)

## 2015-08-01 LAB — CA 125: CA 125: 138.5 U/mL — AB (ref 0.0–38.1)

## 2015-08-01 NOTE — Progress Notes (Signed)
1 Day Post-Op Procedure(s) (LRB): HYSTERECTOMY ABDOMINAL WITH RIGHT SALPINGO OOPHERECTOMY, LEFT SALPINGECTOMY (N/A)  Subjective: Patient reports tolerating PO, + flatus and no problems voiding.  Patient complains of dizziness, especially when ambulating.    Objective: I have reviewed patient's vital signs, intake and output, medications and labs.  Temp:  [98.2 F (36.8 C)-100 F (37.8 C)] 98.3 F (36.8 C) (05/02 1153) Pulse Rate:  [60-74] 65 (05/02 1153) Resp:  [16-20] 18 (05/02 1153) BP: (100-117)/(51-62) 100/56 mmHg (05/02 1153) SpO2:  [95 %-99 %] 97 % (05/02 0354)  General: alert and no distress Resp: clear to auscultation bilaterally Cardio: regular rate and rhythm, S1, S2 normal, no murmur, click, rub or gallop GI: soft, appropriately tender.  Bowel sounds present. Incision with dressing with old dry blood, intact, dry. Extremities: extremities normal, atraumatic, no cyanosis or edema Vaginal Bleeding: minimal   Labs:  CBC Latest Ref Rng 08/01/2015 07/27/2015 06/27/2015  WBC 3.6 - 11.0 K/uL 7.5 3.9 8.6  Hemoglobin 12.0 - 16.0 g/dL 7.9(L) 8.6(L) 6.8(L)  Hematocrit 35.0 - 47.0 % 25.5(L) 28.1(L) 22.1(L)  Platelets 150 - 440 K/uL 166 180 360    Lab Results  Component Value Date   CREATININE 0.64 07/31/2015    Assessment: s/p Procedure(s): HYSTERECTOMY ABDOMINAL WITH RIGHT SALPINGO OOPHERECTOMY, LEFT SALPINGECTOMY (N/A): stable, tolerating diet and anemia, dizziness  Plan: Tolerating regular diet Encourage ambulation with assitance Advance to PO medication.  Patient is currently has only received IV Dilaudid, has not transitioned to Percocet.  Medication could also be a cause of patient's dizziness.  Discontinue IV fluids Anemia - Patient with moderate anemia even prior to procedure.  Did not lose much blood during surgery, no major change in H/H.  VSS, no evidence of hemodynamic changes.  Will change medications and if patient still complains of dizziness with ambulation,  will consider transfusion of 1 unit PRBCs.    LOS: 1 day    Rubie Maid 08/01/2015, 12:37 PM

## 2015-08-02 LAB — HEMOGLOBIN AND HEMATOCRIT, BLOOD
HEMATOCRIT: 26.5 % — AB (ref 35.0–47.0)
HEMOGLOBIN: 8.3 g/dL — AB (ref 12.0–16.0)

## 2015-08-02 LAB — PREPARE RBC (CROSSMATCH)

## 2015-08-02 LAB — SURGICAL PATHOLOGY

## 2015-08-02 MED ORDER — ACETAMINOPHEN 325 MG PO TABS
650.0000 mg | ORAL_TABLET | Freq: Once | ORAL | Status: AC
  Administered 2015-08-02: 650 mg via ORAL
  Filled 2015-08-02: qty 2

## 2015-08-02 MED ORDER — SODIUM CHLORIDE 0.9 % IV SOLN
Freq: Once | INTRAVENOUS | Status: AC
  Administered 2015-08-02: 09:00:00 via INTRAVENOUS

## 2015-08-02 MED ORDER — DIPHENHYDRAMINE HCL 25 MG PO CAPS
25.0000 mg | ORAL_CAPSULE | Freq: Once | ORAL | Status: AC
  Administered 2015-08-02: 25 mg via ORAL
  Filled 2015-08-02: qty 1

## 2015-08-02 MED ORDER — FERROUS SULFATE 325 (65 FE) MG PO TABS
325.0000 mg | ORAL_TABLET | Freq: Three times a day (TID) | ORAL | Status: DC
  Administered 2015-08-02 – 2015-08-03 (×3): 325 mg via ORAL
  Filled 2015-08-02 (×3): qty 1

## 2015-08-02 NOTE — Progress Notes (Signed)
2 Days Post-Op Procedure(s) (LRB): HYSTERECTOMY ABDOMINAL WITH RIGHT SALPINGO OOPHERECTOMY, LEFT SALPINGECTOMY (N/A)  Subjective: Patient reports tolerating PO, + flatus and no problems voiding.  Patient continues to complain of dizziness and weakness when ambulating.  When supine, denies symptoms.   Objective: I have reviewed patient's vital signs, intake and output, medications and labs.  Temp:  [97.9 F (36.6 C)-98.9 F (37.2 C)] 97.9 F (36.6 C) (05/03 0503) Pulse Rate:  [59-67] 67 (05/03 0503) Resp:  [18-20] 18 (05/03 0503) BP: (97-110)/(47-74) 97/47 mmHg (05/03 0503) SpO2:  [96 %-99 %] 99 % (05/03 0503)  General: alert and no distress Resp: clear to auscultation bilaterally Cardio: regular rate and rhythm, S1, S2 normal, no murmur, click, rub or gallop GI: soft, appropriately tender.  Bowel sounds present. Incision with dressing with old dry blood, intact, dry. Extremities: extremities normal, atraumatic, no cyanosis or edema Vaginal Bleeding: minimal   Labs:  CBC Latest Ref Rng 08/01/2015 07/27/2015 06/27/2015  WBC 3.6 - 11.0 K/uL 7.5 3.9 8.6  Hemoglobin 12.0 - 16.0 g/dL 7.9(L) 8.6(L) 6.8(L)  Hematocrit 35.0 - 47.0 % 25.5(L) 28.1(L) 22.1(L)  Platelets 150 - 440 K/uL 166 180 360    Lab Results  Component Value Date   CREATININE 0.64 07/31/2015    Assessment: s/p Procedure(s): HYSTERECTOMY ABDOMINAL WITH RIGHT SALPINGO OOPHERECTOMY, LEFT SALPINGECTOMY (N/A): stable, tolerating diet and anemia, dizziness  Plan: Tolerating regular diet Encourage ambulation with assitance Continue PO medication.  Anemia - Patient with moderate anemia prior to procedure.  Did not lose much blood during surgery, no major change in H/H.  VSS, no evidence of hemodynamic changes.  However patient continues to complain of symptoms of weakness/dizziness with ambulation after medication changes.  Will order transfusion of 1 unit PRBCs.    LOS: 2 days    Rubie Maid 08/02/2015, 8:00  AM

## 2015-08-03 LAB — TYPE AND SCREEN
ABO/RH(D): O POS
ANTIBODY SCREEN: NEGATIVE
UNIT DIVISION: 0
Unit division: 0
Unit division: 0

## 2015-08-03 LAB — URINE CULTURE

## 2015-08-03 MED ORDER — IBUPROFEN 800 MG PO TABS: 800.0000 mg | ORAL_TABLET | Freq: Three times a day (TID) | ORAL | Status: DC | PRN

## 2015-08-03 MED ORDER — OXYCODONE-ACETAMINOPHEN 5-325 MG PO TABS: 1.0000 | ORAL_TABLET | Freq: Four times a day (QID) | ORAL | Status: DC | PRN

## 2015-08-03 MED ORDER — FERROUS SULFATE 325 (65 FE) MG PO TABS: 325.0000 mg | ORAL_TABLET | Freq: Three times a day (TID) | ORAL | Status: AC

## 2015-08-03 MED ORDER — DOCUSATE SODIUM 100 MG PO CAPS: 100.0000 mg | ORAL_CAPSULE | Freq: Two times a day (BID) | ORAL | Status: DC

## 2015-08-03 NOTE — Discharge Summary (Signed)
Gynecology Physician Postoperative Discharge Summary  Patient ID: Sherry Ward MRN: [id number] DOB/AGE: 1965-04-23 50 y.o.  Admit Date: 07/31/2015 Discharge Date: 08/03/2015  Pre-operative Diagnoses: Fibroid uterus, Menorrhagia, Anemia, tubo-ovarian abscess  Post-operative DIagnoses: Fibroid uterus, Menorrhagia, Anemia,  Procedures: Procedure(s) (LRB): HYSTERECTOMY ABDOMINAL WITH RIGHT SALPINGO OOPHERECTOMY, LEFT SALPINGECTOMY (N/A)  Hospital Course:  Sherry Ward is a 50 y.o. [id number] admitted for scheduled surgery.  She underwent the procedures as mentioned above, her operation was uncomplicated. For further details about surgery, please refer to the operative report. Patient had a postoperative course complicated by symptomatic anemia.  She received a blood transfusion of 1 unit PRBCs on POD#2. By time of discharge on POD#3, her pain was controlled on oral pain medications; she was ambulating, voiding without difficulty, tolerating regular diet and passing flatus. She was deemed stable for discharge to home.   Significant Labs: CBC Latest Ref Rng 08/02/2015 08/01/2015 07/27/2015  WBC 3.6 - 11.0 K/uL - 7.5 3.9  Hemoglobin 12.0 - 16.0 g/dL 8.3(L) 7.9(L) 8.6(L)  Hematocrit 35.0 - 47.0 % 26.5(L) 25.5(L) 28.1(L)  Platelets 150 - 440 K/uL - 166 180    Discharge Exam: Blood pressure 97/45, pulse 55, temperature 98.3 F (36.8 C), temperature source Oral, resp. rate 18, last menstrual period 03/06/2015, SpO2 95 %. General appearance: alert and no distress  Resp: clear to auscultation bilaterally  Cardio: regular rate and rhythm  GI: soft, non-tender; bowel sounds normal; no masses, no organomegaly.  Incision: C/D/I, no erythema, no drainage noted Pelvic: scant blood on pad  Extremities: extremities normal, atraumatic, no cyanosis or edema and Homans sign is negative, no sign of DVT  Discharged Condition: Stable  Disposition: 01-Home or Self Care     Medication List    TAKE  these medications        docusate sodium 100 MG capsule  Commonly known as:  COLACE  Take 1 capsule (100 mg total) by mouth 2 (two) times daily.     ferrous sulfate 325 (65 FE) MG tablet  Take 1 tablet (325 mg total) by mouth 3 (three) times daily with meals.     ibuprofen 800 MG tablet  Commonly known as:  ADVIL,MOTRIN  Take 1 tablet (800 mg total) by mouth every 8 (eight) hours as needed.     oxyCODONE-acetaminophen 5-325 MG tablet  Commonly known as:  PERCOCET/ROXICET  Take 1-2 tablets by mouth every 6 (six) hours as needed for severe pain (moderate to severe pain (when tolerating fluids)).           Follow-up Information    Follow up with Rubie Maid, MD.   Specialties:  Obstetrics and Gynecology, Radiology   Why:  For wound re-check   Contact information:   [address] [place] [phone]       Signed: Rubie Maid, MD [facility] 08/03/2015 8:01 AM

## 2015-08-03 NOTE — Progress Notes (Signed)
3 Days Post-Op Procedure(s) (LRB): HYSTERECTOMY ABDOMINAL WITH RIGHT SALPINGO OOPHERECTOMY, LEFT SALPINGECTOMY (N/A)  Subjective: Patient reports tolerating PO, + flatus and no problems voiding.  Feels better after blood transfusion. Denies any further dizziness.   Objective: I have reviewed patient's vital signs, intake and output, medications and labs.  Temp:  [97.9 F (36.6 C)-98.8 F (37.1 C)] 98.3 F (36.8 C) (05/04 0444) Pulse Rate:  [52-69] 55 (05/04 0444) Resp:  [18-20] 18 (05/04 0444) BP: (90-113)/(45-58) 97/45 mmHg (05/04 0444) SpO2:  [95 %-99 %] 95 % (05/04 0444)  General: alert and no distress Resp: clear to auscultation bilaterally Cardio: regular rate and rhythm, S1, S2 normal, no murmur, click, rub or gallop GI: soft, appropriately tender.  Bowel sounds present. Incision with dressing with old dry blood, intact, dry. Extremities: extremities normal, atraumatic, no cyanosis or edema Vaginal Bleeding: minimal   Labs:  CBC Latest Ref Rng 08/02/2015 08/01/2015 07/27/2015  WBC 3.6 - 11.0 K/uL - 7.5 3.9  Hemoglobin 12.0 - 16.0 g/dL 8.3(L) 7.9(L) 8.6(L)  Hematocrit 35.0 - 47.0 % 26.5(L) 25.5(L) 28.1(L)  Platelets 150 - 440 K/uL - 166 180    Lab Results  Component Value Date   CREATININE 0.64 07/31/2015    Assessment: s/p Procedure(s): HYSTERECTOMY ABDOMINAL WITH RIGHT SALPINGO OOPHERECTOMY, LEFT SALPINGECTOMY (N/A): stable, tolerating diet and anemia,   Plan: Tolerating regular diet Encourage ambulation Continue PO medication.  Anemia - S/p transfusion of 1 unit PRBCs with resolution of symptoms.  Will continue PO iron TID as outpatient.  D/c home today.    LOS: 3 days    Sherry Ward 08/03/2015, 8:02 AM

## 2015-08-03 NOTE — Progress Notes (Signed)
Pt discharged home.  Discharge instructions, prescriptions and follow up appointment given to and reviewed with pt.  Pt verbalized understanding.  Escorted by auxillary. 

## 2015-08-03 NOTE — Discharge Instructions (Signed)
General Gynecological Post-Operative Instructions °You may expect to feel dizzy, weak, and drowsy for as long as 24 hours after receiving the medicine that made you sleep (anesthetic).  °Do not drive a car, ride a bicycle, participate in physical activities, or take public transportation until you are done taking narcotic pain medicines or as directed by your doctor.  °Do not drink alcohol or take tranquilizers.  °Do not take medicine that has not been prescribed by your doctor.  °Do not sign important papers or make important decisions while on narcotic pain medicines.  °Have a responsible person with you.  °CARE OF INCISION  °Keep incision clean and dry. °Take showers instead of baths until your doctor gives you permission to take baths.  °Avoid heavy lifting (more than 10 pounds/4.5 kilograms), pushing, or pulling.  °Avoid activities that may risk injury to your surgical site.  °No sexual intercourse or placement of anything in the vagina for 6 weeks or as instructed by your doctor. °If you have tubes coming from the wound site, check with your doctor regarding appropriate care of the tubes. °Only take prescription or over-the-counter medicines  for pain, discomfort, or fever as directed by your doctor. Do not take aspirin. It can make you bleed. Take medicines (antibiotics) that kill germs if they are prescribed for you.  °Call the office or go to the MAU if:  °You feel sick to your stomach (nauseous).  °You start to throw up (vomit).  °You have trouble eating or drinking.  °You have an oral temperature above 101.  °You have constipation that is not helped by adjusting diet or increasing fluid intake. Pain medicines are a common cause of constipation.  °You have any other concerns. °SEEK IMMEDIATE MEDICAL CARE IF:  °You have persistent dizziness.  °You have difficulty breathing or a congested sounding (croupy) cough.  °You have an oral temperature above 102.5, not controlled by medicine.  °There is increasing  pain or tenderness near or in the surgical site.  ° °

## 2015-08-07 ENCOUNTER — Telehealth: Payer: Self-pay

## 2015-08-07 DIAGNOSIS — B952 Enterococcus as the cause of diseases classified elsewhere: Secondary | ICD-10-CM

## 2015-08-07 MED ORDER — CIPROFLOXACIN HCL 250 MG PO TABS: 250.0000 mg | ORAL_TABLET | Freq: Two times a day (BID) | ORAL | Status: DC

## 2015-08-07 NOTE — Telephone Encounter (Signed)
-----   Message from Rubie Maid, MD sent at 08/07/2015 12:56 PM EDT ----- Patient with UTI, needs treatment with Cipro 250 mg BID x 3 days.

## 2015-08-07 NOTE — Telephone Encounter (Signed)
Called pt with interpreter services, unable to contact pt. Will call again. RX sent.

## 2015-08-08 ENCOUNTER — Ambulatory Visit (INDEPENDENT_AMBULATORY_CARE_PROVIDER_SITE_OTHER): Payer: Self-pay | Admitting: Obstetrics and Gynecology

## 2015-08-08 ENCOUNTER — Encounter: Payer: Self-pay | Admitting: Obstetrics and Gynecology

## 2015-08-08 VITALS — BP 99/62 | HR 61 | Ht 60.0 in | Wt 131.8 lb

## 2015-08-08 DIAGNOSIS — B952 Enterococcus as the cause of diseases classified elsewhere: Secondary | ICD-10-CM

## 2015-08-08 DIAGNOSIS — N390 Urinary tract infection, site not specified: Secondary | ICD-10-CM

## 2015-08-08 DIAGNOSIS — T887XXA Unspecified adverse effect of drug or medicament, initial encounter: Secondary | ICD-10-CM

## 2015-08-08 DIAGNOSIS — Z90710 Acquired absence of both cervix and uterus: Secondary | ICD-10-CM

## 2015-08-08 DIAGNOSIS — Z9889 Other specified postprocedural states: Secondary | ICD-10-CM

## 2015-08-08 DIAGNOSIS — T50905A Adverse effect of unspecified drugs, medicaments and biological substances, initial encounter: Secondary | ICD-10-CM

## 2015-08-08 MED ORDER — HYDROCODONE-ACETAMINOPHEN 5-325 MG PO TABS: 1.0000 | ORAL_TABLET | Freq: Four times a day (QID) | ORAL | Status: DC | PRN

## 2015-08-08 NOTE — Progress Notes (Signed)
.    GYNECOLOGY POST-OPERATIVE VISIT  Subjective:     Sherry Ward is a 50 y.o. G17P3002 female who presents to the clinic 1 weeks status post total abdominal hysterectomy, right oophorectomy and bilateral salpingectomy with lysis of adhesions for abnormal uterine bleeding, fibroids and and suspected right adnexal mass (not present during surgery). Eating a regular diet without difficulty but notes decreased appetite. Bowel movements are normal. Pain is controlled with current analgesics. Medications being used: prescription NSAID's including ibuprofen (Motrin) and narcotic analgesics including Percocet.  The following portions of the patient's history were reviewed and updated as appropriate: allergies, current medications, past family history, past medical history, past social history, past surgical history and problem list.  Review of Systems A comprehensive review of systems was negative except for: Gastrointestinal: positive for nausea and vomiting and dizziness when taking pain medications.     Objective:    BP 99/62 mmHg  Pulse 61  Ht 5' (1.524 m)  Wt 131 lb 12.8 oz (59.784 kg)  BMI 25.74 kg/m2  LMP 07/26/2015 General:  alert and no distress  Abdomen: soft, bowel sounds active, non-tender, no hernias  Incision:   healing well, no drainage, no erythema, no hernia, no seroma, no swelling, no dehiscence, incision well approximated, staples in place.         Pathology:  A. UTERUS WITH CERVIX, BILATERAL FALLOPIAN TUBES, AND RIGHT OVARY; TOTAL ABDOMINAL HYSTERECTOMY WITH BILATERAL SALPINGECTOMY AND RIGHT OOPHORECTOMY:  - CERVICITIS WITH NABOTHIAN CYSTS.  - PROLIFERATIVE ENDOMETRIUM.  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  - RIGHT OVARY WITH CYSTIC FOLLICLES TUBAL OVARIAN FIBROUS ADHESIONS.  - DISTORTED RIGHT FALLOPIAN TUBE WITH CHANGES CONSISTENT WITH INFLAMMATORY TUBAL DISEASE.  - DISTORTED LEFT FALLOPIAN TUBE WITH  PARATUBAL CYST AND CHANGES CONSISTENT WITH INFLAMMATORY TUBAL DISEASE.  - NEGATIVE FOR MALIGNANCY.    Labs:  07/27/2015 Urine Culture: Enterococcus Faecalis  Assessment:   Doing well postoperatively. Urinary tract infection  Plan:   1.Changed Percocet to Vicodin for better GI tolerance.  Continue any other current medications. 2. Staples removed, incision site cleaned, steri-strips placed. Wound care discussed. 3. Activity restrictions: no bending, stooping, or squatting, no lifting more than 15 pounds and pelvic rest x 5 weeks 4. Anticipated return to work: 5 weeks. 5. UTI - attempted to notify patient by phone of lab results last week but no ansher.  Informed today.  Cipro prescribed (see orders).  6. Follow up: 5 weeks for final post-op check    [place] Interpreter used for today's visit.   Rubie Maid, MD [facility]

## 2015-09-20 ENCOUNTER — Ambulatory Visit (INDEPENDENT_AMBULATORY_CARE_PROVIDER_SITE_OTHER): Payer: Self-pay | Admitting: Obstetrics and Gynecology

## 2015-09-20 ENCOUNTER — Encounter: Payer: Self-pay | Admitting: Obstetrics and Gynecology

## 2015-09-20 VITALS — BP 111/62 | HR 65 | Ht 60.0 in | Wt 136.8 lb

## 2015-09-20 DIAGNOSIS — R399 Unspecified symptoms and signs involving the genitourinary system: Secondary | ICD-10-CM

## 2015-09-20 DIAGNOSIS — Z90710 Acquired absence of both cervix and uterus: Secondary | ICD-10-CM

## 2015-09-20 DIAGNOSIS — Z90721 Acquired absence of ovaries, unilateral: Secondary | ICD-10-CM

## 2015-09-20 DIAGNOSIS — D500 Iron deficiency anemia secondary to blood loss (chronic): Secondary | ICD-10-CM

## 2015-09-20 DIAGNOSIS — M545 Low back pain: Secondary | ICD-10-CM

## 2015-09-20 DIAGNOSIS — Z9889 Other specified postprocedural states: Secondary | ICD-10-CM

## 2015-09-20 LAB — POCT URINALYSIS DIPSTICK
Bilirubin, UA: NEGATIVE
Glucose, UA: NEGATIVE
Ketones, UA: NEGATIVE
Leukocytes, UA: NEGATIVE
NITRITE UA: NEGATIVE
PROTEIN UA: NEGATIVE
RBC UA: NEGATIVE
SPEC GRAV UA: 1.01
UROBILINOGEN UA: NEGATIVE
pH, UA: 6

## 2015-09-20 MED ORDER — ACETAMINOPHEN-CODEINE #3 300-30 MG PO TABS: 1.0000 | ORAL_TABLET | ORAL | Status: DC | PRN

## 2015-09-20 MED ORDER — SULFAMETHOXAZOLE-TRIMETHOPRIM 800-160 MG PO TABS: 1.0000 | ORAL_TABLET | Freq: Two times a day (BID) | ORAL | Status: DC

## 2015-09-20 MED ORDER — FLUCONAZOLE 150 MG PO TABS: 150.0000 mg | ORAL_TABLET | Freq: Once | ORAL | Status: DC

## 2015-09-20 MED ORDER — HYDROCODONE-ACETAMINOPHEN 5-325 MG PO TABS: 1.0000 | ORAL_TABLET | Freq: Four times a day (QID) | ORAL | Status: DC | PRN

## 2015-09-20 MED ORDER — CYCLOBENZAPRINE HCL 10 MG PO TABS: 10.0000 mg | ORAL_TABLET | Freq: Three times a day (TID) | ORAL | Status: AC | PRN

## 2015-09-20 MED ORDER — METRONIDAZOLE 500 MG PO TABS: 500.0000 mg | ORAL_TABLET | Freq: Two times a day (BID) | ORAL | Status: DC

## 2015-09-20 NOTE — Patient Instructions (Signed)
Dolor de espalda en adultos (Back Pain, Adult) El dolor de espalda es muy frecuente en los adultos.La causa del dolor de espalda es rara vez peligrosa y Conservation officer, historic buildings a menudo mejora con el [place].Es posible que se desconozca la causa de esta afeccin. Algunas causas comunes son las siguientes:  Distensin de los msculos o ligamentos que sostienen la columna vertebral.  Holiday representative (degeneracin) de los discos vertebrales.  Artritis.  Lesiones directas en la espalda. En [employer], el dolor de espalda es recurrente. Como rara vez es peligroso, las personas pueden aprender a Holiday representative afeccin por s mismas. INSTRUCCIONES PARA EL CUIDADO EN EL HOGAR Controle su dolor de espalda a fin de Recruitment consultant cambio. Las siguientes indicaciones ayudarn a Chief Strategy Officer que pueda sentir:  IT consultant. Si permanece sentado o de pie en un mismo lugar durante mucho tiempo, se tensiona la espalda. No se siente, conduzca o permanezca de pie en un mismo lugar durante ms de 30 minutos seguidos. Realice caminatas cortas en superficies planas tan pronto como le sea posible.Trate de caminar un poco ms de Publishing copy.  Haga ejercicio regularmente como se lo haya indicado el mdico. El ejercicio ayuda a que su espalda se cure ms rpidamente. Tambin ayuda a prevenir futuras lesiones al [employer] fuertes y flexibles.  No permanezca en la cama.Si hace reposo ms de 1 a 2 das, puede demorar su recuperacin.  Preste atencin a su cuerpo al inclinarse y levantarse. Las posiciones ms cmodas son las que ejercen menos tensin en la espalda en recuperacin. Siempre use tcnicas apropiadas para levantar objetos, como por ejemplo:  Flexionar las rodillas.  Mantener la carga cerca del cuerpo.  No torcerse.  Encuentre una posicin cmoda para dormir. Use un colchn firme y recustese de costado con las rodillas ligeramente flexionadas. Si se recuesta [employer], coloque  una almohada debajo de las rodillas.  Evite sentir ansiedad o estrs.El estrs aumenta la tensin muscular y puede empeorar el dolor de espalda.Es importante reconocer si se siente ansioso o estresado y aprender maneras de controlarlo, por ejemplo haciendo ejercicio.  Tome los medicamentos solamente como se lo haya indicado el mdico. Los medicamentos de venta libre para Best boy y la inflamacin a menudo son los ms eficaces.El mdico puede recetarle relajantes musculares.Estos medicamentos ayudan a Glass blower/designer de modo que pueda reanudar ms rpidamente sus actividades normales y el ejercicio saludable.  Aplique hielo sobre la zona lesionada.  Ponga el hielo en una bolsa plstica.  Coloque una toalla entre la piel y la bolsa de hielo.  Deje el hielo durante 46minutos, 2 a 3veces por da, durante los primeros 2 o 3das. Despus de eso, puede alternar el hielo y el calor para reducir Conservation officer, historic buildings y los espasmos.  Mantenga un peso saludable. El exceso de peso ejerce presin adicional sobre la espalda y hace que resulte difcil mantener una buena [place]. SOLICITE ATENCIN MDICA SI:  Siente un dolor que no se alivia con reposo o medicamentos.  Siente mucho dolor que se extiende a las piernas o los glteos.  El dolor no mejora en una semana.  Siente dolor por la noche.  Pierde peso.  Siente escalofros o fiebre. SOLICITE ATENCIN MDICA DE INMEDIATO SI:   Tiene nuevos problemas para controlar la vejiga o los intestinos.  Siente debilidad o adormecimiento inusuales en los brazos o en las piernas.  Siente nuseas o vmitos.  Siente dolor abdominal.  Siente que va a desmayarse.  Esta informacin no tiene Marine scientist el consejo del mdico. Asegrese de hacerle al mdico cualquier pregunta que tenga.   Document Released: 03/18/2005 Document Revised: 04/08/2014 Elsevier Interactive Patient Education 2016 [place] bacteriana (Bacterial  Vaginosis) La vaginosis bacteriana es una infeccin vaginal que perturba el equilibrio normal de las bacterias que se encuentran en la vagina. Es el resultado de un crecimiento excesivo de ciertas bacterias. Esta es la infeccin vaginal ms frecuente en mujeres en edad reproductiva. El tratamiento es importante para prevenir complicaciones, especialmente en mujeres embarazadas, dado que puede causar un parto prematuro. CAUSAS  La vaginosis bacteriana se origina por un aumento de bacterias nocivas que, generalmente, estn presentes en cantidades ms pequeas en la vagina. Varios tipos diferentes de bacterias pueden causar esta afeccin. Sin embargo, la causa de su desarrollo no se comprende totalmente. [place] o comportamientos pueden exponerlo a un mayor riesgo de desarrollar vaginosis bacteriana, entre los que se incluyen:  Tener una nueva pareja sexual o mltiples parejas sexuales.  Las duchas vaginales  El uso del DIU (dispositivo intrauterino) como mtodo anticonceptivo. El contagio no se produce en baos, por ropas de cama, en piscinas o por contacto con objetos. SIGNOS Y SNTOMAS  Algunas mujeres que padecen vaginosis bacteriana no presentan signos ni sntomas. Los sntomas ms comunes son:  Secrecin vaginal de color grisceo.  Secrecin vaginal con olor similar al [employer], especialmente despus de Retail banker.  Picazn o sensacin de ardor en la vagina o la vulva.  Ardor o dolor al [employer]. DIAGNSTICO  Su mdico analizar su historia clnica y le examinar la vagina para detectar signos de vaginosis bacteriana. Puede tomarle Truddie Coco de flujo vaginal. Su mdico examinar esta muestra con un microscopio para controlar las bacterias y clulas anormales. Tambin puede realizarse un anlisis del pH vaginal.  TRATAMIENTO  La vaginosis bacteriana puede tratarse con antibiticos, en forma de comprimidos o de crema vaginal. Puede indicarse  una segunda tanda de antibiticos si la afeccin se repite despus del tratamiento. Debido a que la vaginosis bacteriana aumenta el riesgo de contraer enfermedades de transmisin sexual, el tratamiento puede ayudar a reducir el riesgo de clamidia, [place], VIH y herpes. [place] solo medicamentos de venta libre o recetados, segn las indicaciones del mdico.  Si le han recetado antibiticos, tmelos como se le indic. Asegrese de que finaliza la prescripcin completa aunque se sienta mejor.  Comunique a sus compaeros sexuales que sufre una infeccin vaginal. Deben consultar a su mdico y recibir tratamiento si tienen problemas, como picazn o una erupcin cutnea leve.  Durante el [place], es importante que siga estas indicaciones:  Visual merchandiser relaciones sexuales o use preservativos de la forma correcta.  No se haga duchas vaginales.  Evite consumir alcohol como se lo haya indicado el mdico.  Community education officer se lo haya indicado el mdico. SOLICITE ATENCIN MDICA SI:   Sus sntomas no mejoran despus de 3 das de [place].  Aumenta la secrecin o Conservation officer, historic buildings.  Tiene fiebre. ASEGRESE DE QUE:   Comprende estas instrucciones.  Controlar su afeccin.  Recibir ayuda de inmediato si no mejora o si empeora. PARA OBTENER MS INFORMACIN  Centros para el control y la prevencin de Probation officer for Disease Control and Prevention, CDC): AppraiserFraud.fi Asociacin Estadounidense de la Salud Sexual (American Sexual Health Association, SHA): www.ashastd.org    Esta informacin no tiene Marine scientist el consejo del mdico.  Asegrese de hacerle al mdico cualquier pregunta que tenga.   Document Released: 06/25/2007 Document Revised: 04/08/2014 Elsevier Interactive Patient Education 2016 [place]  (Urinary Tract Infection)  Ardelia Mems infeccin urinaria puede ocurrir en cualquier lugar del tracto  urinario. El tracto [employer] riones, urteres, la vejiga y Geologist, engineering. La causa es un germen llamado bacteria. La infeccin urinaria mejora con antibiticos.  CUIDADOS EN EL HOGAR   Si le recetaron antibiticos, tmelos como le haya indicado el mdico. Tmelos todos, aunque se sienta mejor.  Beba gran cantidad de lquido para mantener el pis (orina) de tono claro o amarillo plido.  Evite el t, las bebidas con cafena y las bebidas gaseosas (carbonatada).  Orine con frecuencia. Evite retener la [employer].  Orine antes y despus de tener sexo (relaciones sexuales).  Si es [place], higiencese desde adelante hacia atrs despus de ir de cuerpo (mover el intestino). Use slo un papel tissue por vez. SOLICITE AYUDA DE INMEDIATO SI:   Siente dolor en la espalda.  Siente un dolor en el vientre (abdominal) muy intenso.  Tiene escalofros.  Tiene Higher education careers adviser (nuseas).  Vomita.  El ardor o las molestias al orinar no desaparecen.  Tiene fiebre.  Los sntomas no mejoran despus de 3 das. ASEGRESE DE QUE:   Comprende estas instrucciones.  Controlar su enfermedad.  Solicitar ayuda de inmediato si no mejora o si empeora.   Esta informacin no tiene Marine scientist el consejo del mdico. Asegrese de hacerle al mdico cualquier pregunta que tenga.   Document Released: 09/05/2009 Document Revised: 12/11/2011 Elsevier Interactive Patient Education [employer].

## 2015-09-20 NOTE — Progress Notes (Signed)
.  GYNECOLOGY POST-OPERATIVE VISIT  Subjective:     Sherry Ward is a 50 y.o. G68P3002 female who presents to the clinic 6 weeks status post total abdominal hysterectomy, right oophorectomy and bilateral salpingectomy with lysis of adhesions for abnormal uterine bleeding, fibroids and and suspected right adnexal mass (not present during surgery). Eating a regular diet without difficulty. Bowel movements are normal. Pain is not well controlled.  Medications being used: prescription NSAID's including ibuprofen (Motrin) and narcotic analgesics including hydrocodone/acetaminophen (Lorcet, Lortab, Norco, Vicodin).  Notes that she was not able to take the Vicodin due to GI intolerance.  Notes lower back pain and abdominal pain.  Pain was not relieved with Flexeril. Does also note pain associated with urination.  Of note, patient with h/o UTI just prior to hysterectomy that was treated.   The following portions of the patient's history were reviewed and updated as appropriate: allergies, current medications, past family history, past medical history, past social history, past surgical history and problem list.  Review of Systems A comprehensive review of systems was negative except for: Gastrointestinal: positive for nausea and vomiting and dizziness when taking pain medications.     Objective:    BP 111/62 mmHg  Pulse 65  Ht 5' (1.524 m)  Wt 136 lb 12.8 oz (62.052 kg)  BMI 26.72 kg/m2  LMP 07/26/2015 General:  alert and no distress  Back:  Mild CVA tenderness on right.  Mild point tenderness in lower back near midline.   Abdomen: soft, bowel sounds active, non-tender, no hernias  Incision:   well healed, no drainage, no erythema, no hernia, no seroma, no swelling, no dehiscence, incision well approximated.   Pelvis:   external genitalia normal, rectovaginal septum normal.  Vagina with moderate amount of white thin discharge.  Vaginal cuff well healed, but slightly tender.  Small amount of  suture material noted at base of cuff. Uterus and cervix surgically absent. Adnexae non-palpable, nontender bilaterally.         Pathology:  A. UTERUS WITH CERVIX, BILATERAL FALLOPIAN TUBES, AND RIGHT OVARY; TOTAL ABDOMINAL HYSTERECTOMY WITH BILATERAL SALPINGECTOMY AND RIGHT OOPHORECTOMY:  - CERVICITIS WITH NABOTHIAN CYSTS.  - PROLIFERATIVE ENDOMETRIUM.  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  - RIGHT OVARY WITH CYSTIC FOLLICLES TUBAL OVARIAN FIBROUS ADHESIONS.  - DISTORTED RIGHT FALLOPIAN TUBE WITH CHANGES CONSISTENT WITH INFLAMMATORY TUBAL DISEASE.  - DISTORTED LEFT FALLOPIAN TUBE WITH PARATUBAL CYST AND CHANGES CONSISTENT WITH INFLAMMATORY TUBAL DISEASE.  - NEGATIVE FOR MALIGNANCY.    Labs:  07/27/2015 Urine Culture: Enterococcus Faecalis  Lab Results  Component Value Date   WBC 7.5 08/01/2015   HGB 8.3* 08/02/2015   HCT 26.5* 08/02/2015   MCV 64.0* 08/01/2015   PLT 166 08/01/2015    Assessment:   Postoperative course complicated by pelvic pain and suspected UTI symptoms.  Anemia, post-operative  Plan:   1.Previously changed from Percocet to Vicodin for better GI tolerance.  Still noting GI intolerance with Vicodin. Given prescription for T#3.  Continue ibuprofen. 2. Wet prep with BV noted. Given prescription for Flagyl, will also give prophylactic dose of Diflucan for possible post-antibiotic yeast infection.  3. Activity restrictions: pelvic rest x 1 week 4. Anticipated return to work: now at reduced duties x 1 week, then back to full duty and work letter provided. 5. UTI symptoms. UA and urine culture ordered. Will treat pophylactically with Bactrim.  6.Anemia - has been taking ferrous sulfate.  Will repeat Hgb.  7. Follow up:  4 weeks to recheck symptoms.   [place] Interpreter used for today's visit.    Rubie Maid, MD [facility]

## 2015-09-22 LAB — URINE CULTURE: ORGANISM ID, BACTERIA: NO GROWTH

## 2015-10-24 ENCOUNTER — Ambulatory Visit: Payer: Self-pay | Admitting: Obstetrics and Gynecology

## 2015-11-09 ENCOUNTER — Ambulatory Visit (INDEPENDENT_AMBULATORY_CARE_PROVIDER_SITE_OTHER): Payer: Self-pay | Admitting: Obstetrics and Gynecology

## 2015-11-09 ENCOUNTER — Encounter: Payer: Self-pay | Admitting: Obstetrics and Gynecology

## 2015-11-09 VITALS — BP 108/64 | HR 57 | Ht 60.0 in | Wt 137.5 lb

## 2015-11-09 DIAGNOSIS — R102 Pelvic and perineal pain: Secondary | ICD-10-CM

## 2015-11-09 NOTE — Progress Notes (Signed)
    GYNECOLOGY PROGRESS NOTE  Subjective:    Patient ID: Sherry Ward, female    DOB: [birth year], 50 y.o.   MRN: [id number]  HPI  Patient is a 50 y.o. G17P3002 female who presents for follow follow up of abdominal/pelvic pain.  Notes that the pain was bilateral (however L>R) after surgery, however now notes that pain is now only on the left.  Patient also notes that incisional pain has also resolved. Reports that the pain medication helps some, however only offers temporary relief,  denies urinary complaints, nausea/vomiting, back pain.  Pain is described as dull and achy.  The following portions of the patient's history were reviewed and updated as appropriate: allergies, current medications, past family history, past medical history, past social history, past surgical history and problem list.  Review of Systems Pertinent items noted in HPI and remainder of comprehensive ROS otherwise negative.   Objective:   Blood pressure 108/64, pulse (!) 57, height 5' (1.524 m), weight 137 lb 8 oz (62.4 kg), last menstrual period 07/26/2015. General appearance: alert and no distress Abdomen: normal findings: bowel sounds normal, no masses palpable and soft and abnormal findings:  mild tenderness in the LLQ.  Vertical incision well healed.  Pelvic: external genitalia normal, rectovaginal septum normal.  Vagina without discharge.  Uterus and cervix surgically absent.  Right adnexa surgically absent. Left adnexa non-palpable, mildly tender.  Extremities: extremities normal, atraumatic, no cyanosis or edema Neurologic: Grossly normal   Assessment:   LLQ pain  Plan:   - Patient notes persistent LLQ pain since surgery.  Discussed possible etiologies, including adhesions, possible cyst on remaining left ovary (as patient does have history of ovarian cyst).  Patient notes that bowel movements are regular, no complaints of constipation.  Will start with pelvic ultrasound to assess left lower  quadrant.   - Discussion had with patient regarding cost of workup. Patient is currently self-pain. Discussion had on payment plan versus referral to [facility] where she may be able to receive more financial assistance. Patientis she desires to continue follow-up at [facility], will utilize payment plan. - Patient to follow-up in 2-4 weeks after ultrasound performed.   Rubie Maid, MD [facility]

## 2015-12-06 ENCOUNTER — Ambulatory Visit (INDEPENDENT_AMBULATORY_CARE_PROVIDER_SITE_OTHER): Payer: Self-pay

## 2015-12-06 ENCOUNTER — Encounter: Payer: Self-pay | Admitting: Obstetrics and Gynecology

## 2015-12-06 ENCOUNTER — Ambulatory Visit (INDEPENDENT_AMBULATORY_CARE_PROVIDER_SITE_OTHER): Payer: Self-pay | Admitting: Obstetrics and Gynecology

## 2015-12-06 VITALS — BP 97/62 | HR 65 | Ht 60.0 in | Wt 136.4 lb

## 2015-12-06 DIAGNOSIS — R1032 Left lower quadrant pain: Secondary | ICD-10-CM

## 2015-12-06 DIAGNOSIS — R102 Pelvic and perineal pain: Secondary | ICD-10-CM

## 2015-12-06 MED ORDER — ACETAMINOPHEN-CODEINE #3 300-30 MG PO TABS: 1.0000 | ORAL_TABLET | ORAL | 1 refills | Status: AC | PRN

## 2015-12-06 NOTE — Patient Instructions (Signed)
Adherencias (Adhesions) Las adherencias son bandas de tejido cicatrizal que pueden hacer que algunas partes del interior de su vientre (abdomen) se peguen. Las adherencias abdominales pueden torcer los intestinos. Esto puede bloquear el paso de los alimentos o Engineer, drilling. Las adherencias pueden aparecer debido a cirugas, infecciones, o radioterapia. Las zonas ms comunes en las que se forman adherencias son el vientre y la pelvis. CUIDADOS EN EL HOGAR  Slo tome medicamentos como lo indique su mdico.  Preste atencin al dolor:  [place]?  [place]ing [place]arry de lugar?  Ha desaparecido?  Coma y beba segn las indicaciones del profesional que lo asiste. El mdico le dir si debe seguir una dieta especial. SOLICITE AYUDA DE INMEDIATO SI:  El dolor no desaparece luego de 24 horas.  El dolor [place], cambia de Environmental consultant o se siente diferente.  Tiene fiebre.  No puede parar de vomitar.  Observa sangre o partculas de sangre (se ven como caf molido) en el vmito.  Presenta sangre en la materia fecal.  La materia fecal se ve oscura o negra.  No puede ir de cuerpo (constipacin) o eliminar gases. ASEGRESE QUE:  Comprende estas instrucciones.  Controlar su enfermedad.  Solicitar ayuda de inmediato si no mejora o empeora.   Esta informacin no tiene Marine scientist el consejo del mdico. Asegrese de hacerle al mdico cualquier pregunta que tenga.   Document Released: 04/20/2010 Document Revised: 09/17/2011 Elsevier Interactive Patient Education [employer].

## 2015-12-06 NOTE — Progress Notes (Signed)
    GYNECOLOGY PROGRESS NOTE  Subjective:    Patient ID: Sherry Ward, female    DOB: 1965-07-20, 50 y.o.   MRN: [id number]  HPI  Patient is a 50 y.o. [id number] female who presents for follow up of left sided abdominal/pelvic pain. Patient notes that pain has improved some since last visit.  Is still aggravated by strenuous activity (or prolonged working).  Pain is described as dull and achy.  Did not get prescription filled last visit as recommended.  Notes that she lost the prescription. Reports nausea with occasional vomiting after meals.  Cannot consume large meals.   Denies constipation.   Is following for ultrasound results to r/o ovarian mass as patient with h/o large right adnexal cyst prior to hysterectomy.  The following portions of the patient's history were reviewed and updated as appropriate: allergies, current medications, past family history, past medical history, past social history, past surgical history and problem list.  Review of Systems Pertinent items noted in HPI and remainder of comprehensive ROS otherwise negative.   Objective:   Blood pressure 97/62, pulse 65, height 5' (1.524 m), weight 136 lb 6.4 oz (61.9 kg), last menstrual period 07/26/2015. General appearance: alert and no distress Abdomen: normal findings: bowel sounds normal, no masses palpable and soft and abnormal findings:  mild tenderness in the LLQ.  Infraumbilical vertical incision well healed.     Imaging (12/06/2015):  Location: [facility] Date of Service: 12/06/15  Indications:Pelvic Pain in LLQ Findings:  The uterus is surgically absent.  Right Ovary is surgically absent. Left Ovary measures 2.7 x 1.5 x 2.4 cm. It is normal appearance. Survey of the adnexa demonstrates no adnexal masses. There is no free fluid in the cul de sac.  Impression: 1. WNL  Assessment:   LLQ pain  Plan:   - Patient notes persistent LLQ pain since surgery.  Reiterated possible  etiologies, including adhesions, possible cyst on remaining left ovary (as patient does have history of ovarian cyst). Pelvic ultrasound ruled out ovarian cyst.  Patient likely with adhesions as she has had other abdominal/pelvic surgeries (2 C-sections).   Advised that pain may eventually get better over time.  Discussed use of an abdominal binder when at work, local anesthetic patches over area of discomfort as needed.  Also offered referral to physical therapist to help with pain.  Patient declines currently due to cost  (is self pay).  Also discussed that occasionally a laparoscopy can be performed to remove adhesions if symptoms continue to worsen (however will hold off for now as patient is currently self pay). Advised on eating smaller more frequent meals.  Refilled pain medication (tylenol #3).   - Patient to f/u in another 4-6 weeks if above recommendations are not helping, or if symptoms progressively worsen.     Due to language barrier, an interpreter was present during the history-taking and subsequent discussion (and for part of the physical exam) with this patient.   A total of 15 minutes were spent face-to-face with the patient during this encounter and over half of that time dealt with counseling and coordination of care.     Rubie Maid, MD [facility]

## 2015-12-08 ENCOUNTER — Encounter: Payer: Self-pay | Admitting: Obstetrics and Gynecology

## 2015-12-08 DIAGNOSIS — R1032 Left lower quadrant pain: Secondary | ICD-10-CM | POA: Insufficient documentation

## 2017-04-04 IMAGING — CT CT ABD-PELV W/ CM
1 of 3 series · 13 of 32 positions shown, 18 images · IV contrast (iopamidol)
Comparison: 07/15/2012

CLINICAL DATA: Left lower quadrant pain. Diarrhea for the past
week. Elevated white blood cell count.

EXAM:
CT ABDOMEN AND PELVIS WITH CONTRAST
TECHNIQUE: Multidetector CT imaging of the abdomen and pelvis was performed
using the standard protocol following bolus administration of
intravenous contrast.
CONTRAST:  100mL [id number] IOPAMIDOL ([id number]) INJECTION 61%

[Series 2: routine abd pel with · axial · 0.68mm/px · z∈[-1116,-710]mm · 13 of 91 slices shown, 18 images]
[im 5/91  soft-tissue]
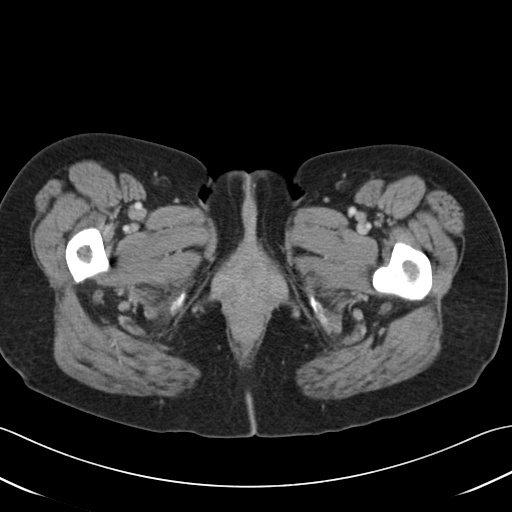
[im 5/91  bone]
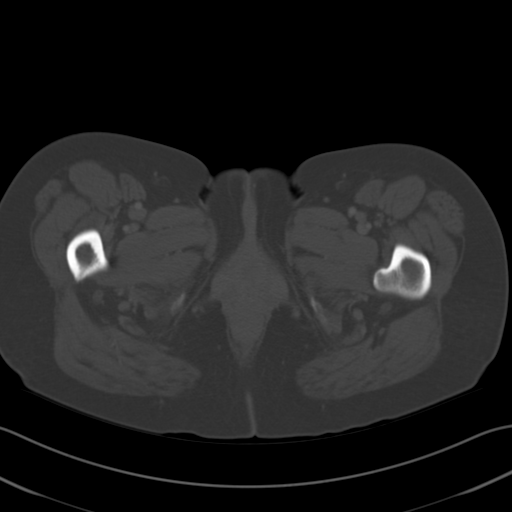
[im 15/91  soft-tissue]
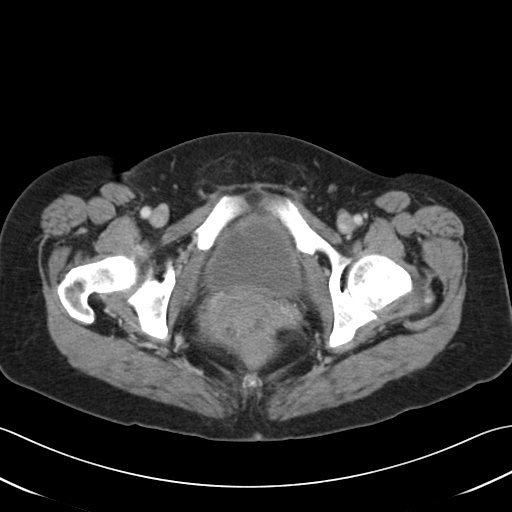
[im 19/91  soft-tissue]
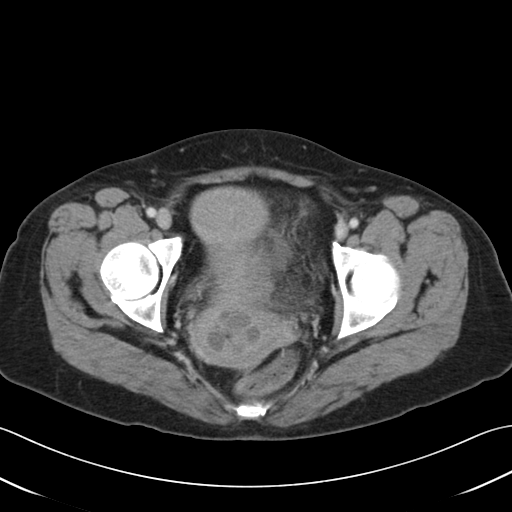
[im 29/91  soft-tissue]
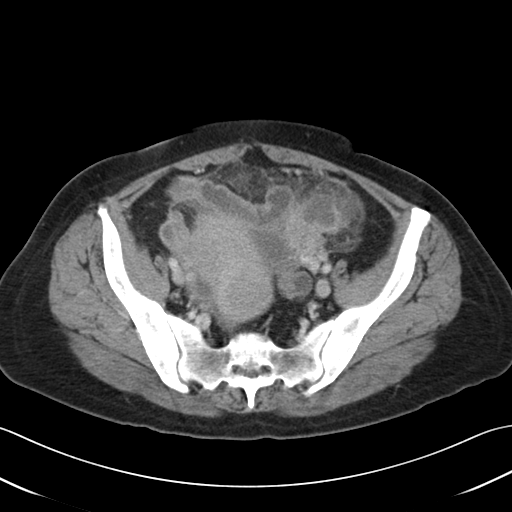
[im 34/91  soft-tissue]
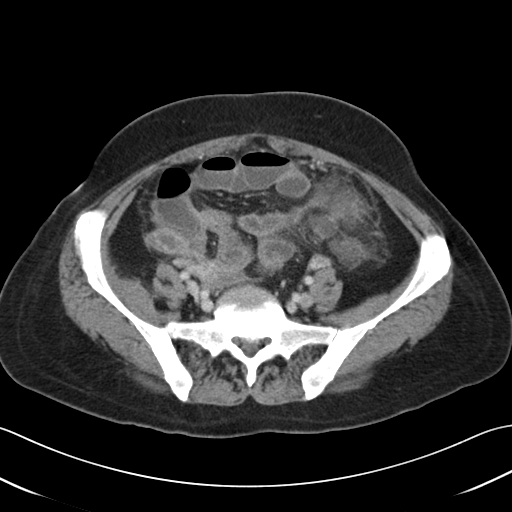
[im 43/91  soft-tissue]
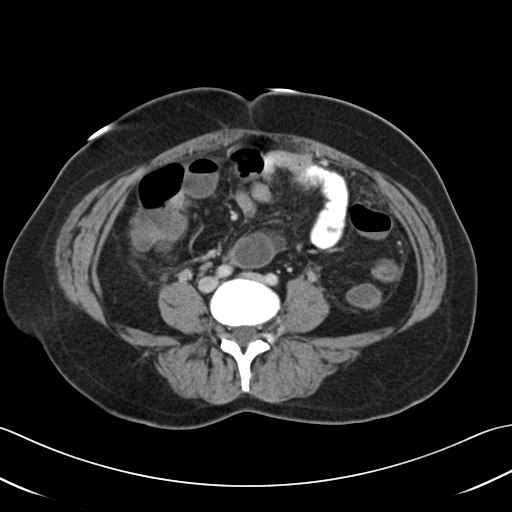
[im 48/91  soft-tissue]
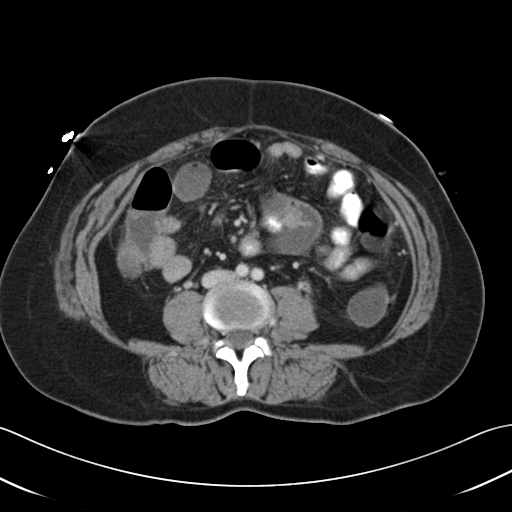
[im 57/91  soft-tissue]
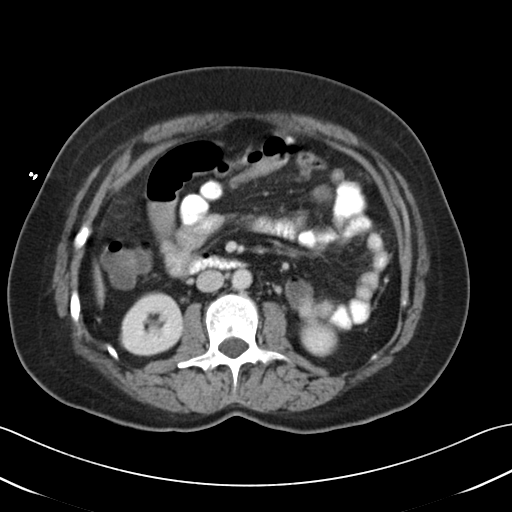
[im 62/91  soft-tissue]
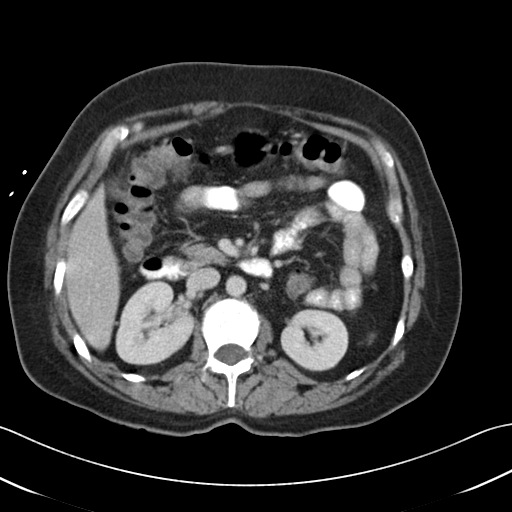
[im 62/91  bone]
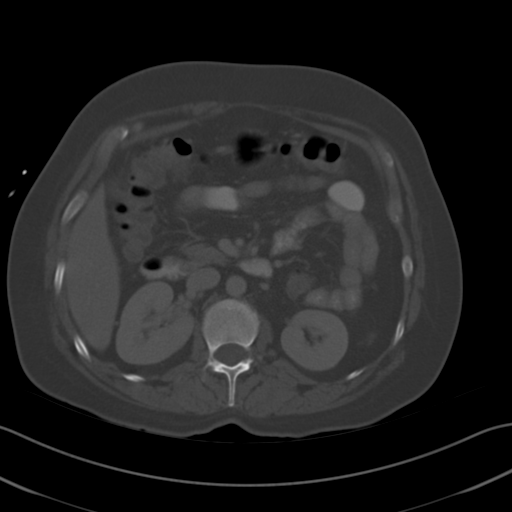
[im 72/91  soft-tissue]
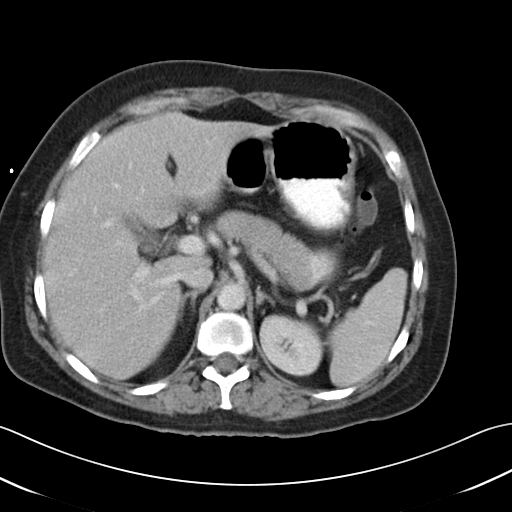
[im 72/91  lung]
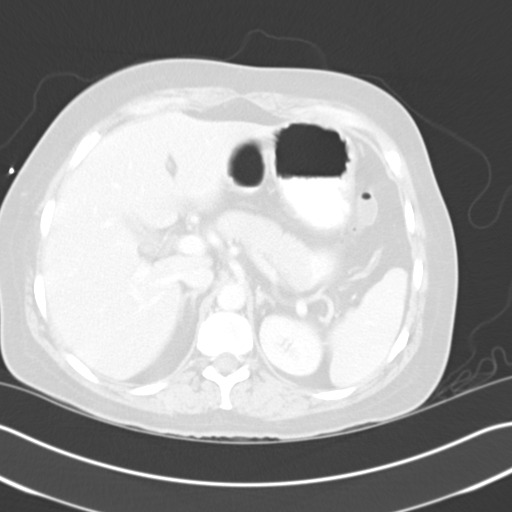
[im 76/91  soft-tissue]
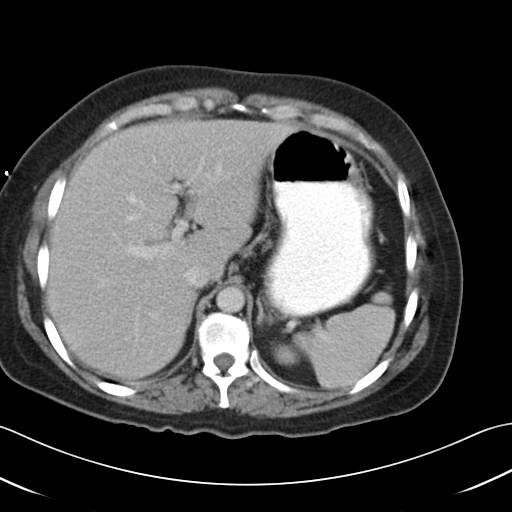
[im 76/91  lung]
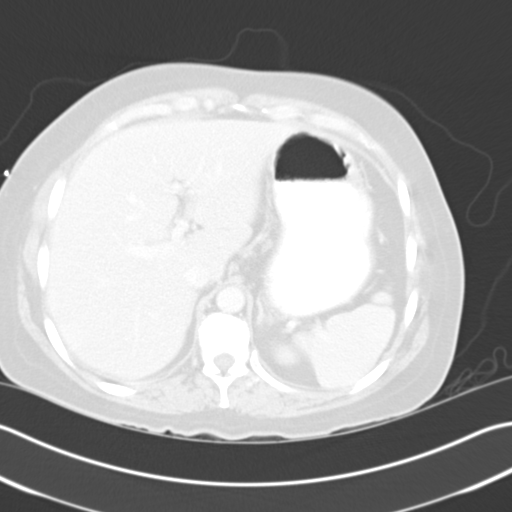
[im 81/91  lung]
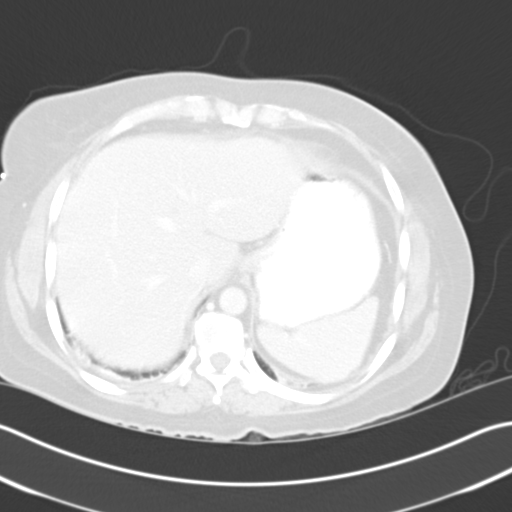
[im 86/91  soft-tissue]
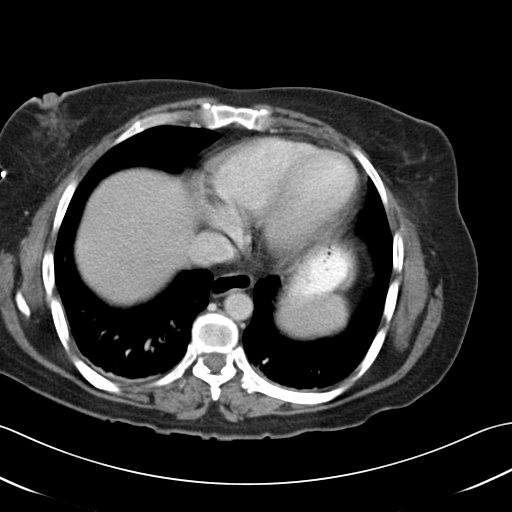
[im 86/91  lung]
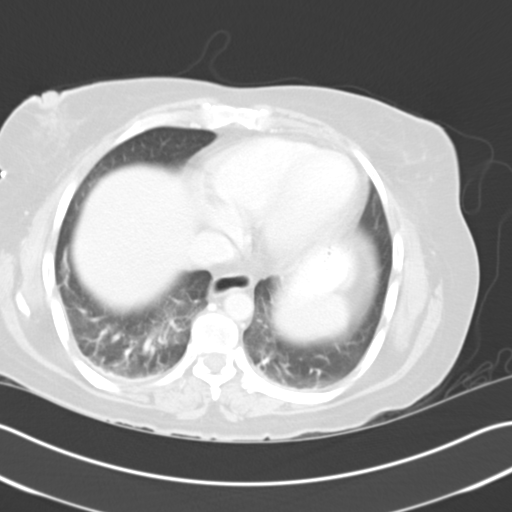

[13 of 32 positions shown; findings below may reference images not displayed]

FINDINGS: Respiratory motion artifact is present in the visualized lung bases
with dependent subsegmental atelectasis bilaterally. There is no
pleural effusion.

The liver, gallbladder, spleen, adrenal glands, kidneys, and
pancreas have an unremarkable enhanced appearance. There is no
biliary dilatation.

There is prominent inflammation in the left pelvis. There is a
dilated fluid-filled tubular structure with prominent wall
enhancement in the left adnexa which appears separate from colon and
small bowel with extensive surrounding inflammatory fat stranding.
This structure in total measures approximately 7 x 4 cm, with
portions of the structure's wall at its anterosuperior aspect
appearing less well-defined and potentially reflecting perforation.
There is a small amount of fluid more medially in the pelvis along
the superior margin of the uterus without well-defined rim
enhancement. Mild hyperenhancement of several adjacent loops of
small bowel in this region is likely reactive due to the adjacent
inflammation rather than reflecting primary small bowel pathology.
There is no evidence of bowel obstruction.

Small amount of partially loculated fluid extends into the right
adnexal region with mild hyperenhancement in the region of the right
ovary/tube. The uterus is enlarged by numerous fibroids, with the
largest measuring 7.0 x 4.0 cm at the fundus and having mildly
enlarged in the interim.

A 7 mm left pelvic sidewall lymph node is slightly larger than on
the prior CT and likely reactive. The aorta is normal in caliber. No
acute osseous abnormality is identified.
IMPRESSION: Extensive inflammatory process in the pelvis with dilated tubular
structure in the left adnexa most concerning for PID with pyosalpinx
and tubo-ovarian abscess.

## 2019-07-24 ENCOUNTER — Ambulatory Visit: Payer: Self-pay | Attending: Internal Medicine

## 2019-07-24 DIAGNOSIS — Z23 Encounter for immunization: Secondary | ICD-10-CM

## 2019-07-24 NOTE — Progress Notes (Signed)
   Covid-19 Vaccination Clinic  Name:  Sherry Ward    MRN: [id number] DOB: 09-24-1965  07/24/2019  Sherry Ward was observed post Covid-19 immunization for 15 minutes without incident. She was provided with Vaccine Information Sheet and instruction to access the V-Safe system.   Sherry Ward was instructed to call 911 with any severe reactions post vaccine: Marland Kitchen Difficulty breathing  . Swelling of face and throat  . A fast heartbeat  . A bad rash all over body  . Dizziness and weakness   Immunizations Administered    Name Date Dose VIS Date Route   Pfizer COVID-19 Vaccine 07/24/2019  9:42 AM 0.3 mL 05/26/2018 Intramuscular   Manufacturer: [employer], [employer]   Lot: [id number]   [place]: [id number]

## 2019-08-14 ENCOUNTER — Ambulatory Visit: Payer: Self-pay | Attending: Internal Medicine

## 2019-08-14 ENCOUNTER — Other Ambulatory Visit: Payer: Self-pay

## 2019-08-14 DIAGNOSIS — Z23 Encounter for immunization: Secondary | ICD-10-CM

## 2019-08-14 NOTE — Progress Notes (Signed)
   Covid-19 Vaccination Clinic  Name:  Sherry Ward    MRN: [id number] DOB: 1965-06-26  08/14/2019  [name] was observed post Covid-19 immunization for 15 minutes without incident. She was provided with Vaccine Information Sheet and instruction to access the V-Safe system.   [name] was instructed to call 911 with any severe reactions post vaccine: Marland Kitchen Difficulty breathing  . Swelling of face and throat  . A fast heartbeat  . A bad rash all over body  . Dizziness and weakness   Immunizations Administered    Name Date Dose VIS Date Route   Pfizer COVID-19 Vaccine 08/14/2019  8:29 AM 0.3 mL 05/26/2018 Intramuscular   Manufacturer: [place]   Lot: [id number]   [place]: [id number]
